# Patient Record
Sex: Female | Born: 1980 | Race: Black or African American | Hispanic: No | Marital: Married | Smoking: Former smoker
Health system: Southern US, Community
[De-identification: ages and names within clinical notes are randomized; demographics above are authoritative.]

## PROBLEM LIST (undated history)

## (undated) ENCOUNTER — Inpatient Hospital Stay (HOSPITAL_COMMUNITY): Payer: Self-pay

## (undated) DIAGNOSIS — I10 Essential (primary) hypertension: Secondary | ICD-10-CM

## (undated) DIAGNOSIS — I499 Cardiac arrhythmia, unspecified: Secondary | ICD-10-CM

## (undated) DIAGNOSIS — O24419 Gestational diabetes mellitus in pregnancy, unspecified control: Secondary | ICD-10-CM

## (undated) DIAGNOSIS — I4891 Unspecified atrial fibrillation: Secondary | ICD-10-CM

## (undated) DIAGNOSIS — K219 Gastro-esophageal reflux disease without esophagitis: Secondary | ICD-10-CM

## (undated) DIAGNOSIS — Z9889 Other specified postprocedural states: Secondary | ICD-10-CM

## (undated) DIAGNOSIS — J390 Retropharyngeal and parapharyngeal abscess: Secondary | ICD-10-CM

## (undated) DIAGNOSIS — T4145XA Adverse effect of unspecified anesthetic, initial encounter: Secondary | ICD-10-CM

## (undated) DIAGNOSIS — T8859XA Other complications of anesthesia, initial encounter: Secondary | ICD-10-CM

## (undated) DIAGNOSIS — R112 Nausea with vomiting, unspecified: Secondary | ICD-10-CM

---
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
## 2000-01-31 ENCOUNTER — Emergency Department (HOSPITAL_COMMUNITY): Admission: EM | Admit: 2000-01-31 | Discharge: 2000-01-31 | Payer: Self-pay | Admitting: Emergency Medicine

## 2001-06-17 ENCOUNTER — Emergency Department (HOSPITAL_COMMUNITY): Admission: EM | Admit: 2001-06-17 | Discharge: 2001-06-17 | Payer: Self-pay | Admitting: Emergency Medicine

## 2001-07-29 ENCOUNTER — Other Ambulatory Visit: Admission: RE | Admit: 2001-07-29 | Discharge: 2001-07-29 | Payer: Self-pay | Admitting: *Deleted

## 2001-07-29 ENCOUNTER — Encounter: Admission: RE | Admit: 2001-07-29 | Discharge: 2001-07-29 | Payer: Self-pay | Admitting: *Deleted

## 2001-08-19 ENCOUNTER — Encounter: Admission: RE | Admit: 2001-08-19 | Discharge: 2001-08-19 | Payer: Self-pay | Admitting: *Deleted

## 2001-09-11 ENCOUNTER — Encounter: Admission: RE | Admit: 2001-09-11 | Discharge: 2001-09-11 | Payer: Self-pay | Admitting: Obstetrics and Gynecology

## 2002-03-03 ENCOUNTER — Encounter: Admission: RE | Admit: 2002-03-03 | Discharge: 2002-03-03 | Payer: Self-pay | Admitting: Obstetrics and Gynecology

## 2002-03-03 ENCOUNTER — Other Ambulatory Visit: Admission: RE | Admit: 2002-03-03 | Discharge: 2002-03-03 | Payer: Self-pay | Admitting: *Deleted

## 2002-03-17 ENCOUNTER — Encounter: Admission: RE | Admit: 2002-03-17 | Discharge: 2002-03-17 | Payer: Self-pay | Admitting: Obstetrics and Gynecology

## 2002-04-21 ENCOUNTER — Encounter: Admission: RE | Admit: 2002-04-21 | Discharge: 2002-04-21 | Payer: Self-pay | Admitting: Obstetrics and Gynecology

## 2002-04-21 ENCOUNTER — Other Ambulatory Visit: Admission: RE | Admit: 2002-04-21 | Discharge: 2002-04-21 | Payer: Self-pay | Admitting: *Deleted

## 2002-05-05 ENCOUNTER — Encounter: Admission: RE | Admit: 2002-05-05 | Discharge: 2002-05-05 | Payer: Self-pay | Admitting: Obstetrics and Gynecology

## 2002-05-18 ENCOUNTER — Encounter (INDEPENDENT_AMBULATORY_CARE_PROVIDER_SITE_OTHER): Payer: Self-pay | Admitting: Specialist

## 2002-05-18 ENCOUNTER — Ambulatory Visit (HOSPITAL_COMMUNITY): Admission: RE | Admit: 2002-05-18 | Discharge: 2002-05-18 | Payer: Self-pay | Admitting: Obstetrics and Gynecology

## 2002-05-19 ENCOUNTER — Inpatient Hospital Stay (HOSPITAL_COMMUNITY): Admission: AD | Admit: 2002-05-19 | Discharge: 2002-05-19 | Payer: Self-pay | Admitting: *Deleted

## 2002-06-09 ENCOUNTER — Encounter: Admission: RE | Admit: 2002-06-09 | Discharge: 2002-06-09 | Payer: Self-pay | Admitting: Obstetrics and Gynecology

## 2002-08-04 ENCOUNTER — Encounter: Admission: RE | Admit: 2002-08-04 | Discharge: 2002-08-04 | Payer: Self-pay | Admitting: Obstetrics and Gynecology

## 2002-10-20 ENCOUNTER — Encounter: Admission: RE | Admit: 2002-10-20 | Discharge: 2002-10-20 | Payer: Self-pay | Admitting: Obstetrics and Gynecology

## 2002-10-20 ENCOUNTER — Encounter (INDEPENDENT_AMBULATORY_CARE_PROVIDER_SITE_OTHER): Payer: Self-pay | Admitting: *Deleted

## 2003-03-11 ENCOUNTER — Encounter: Admission: RE | Admit: 2003-03-11 | Discharge: 2003-03-11 | Payer: Self-pay | Admitting: Obstetrics and Gynecology

## 2003-06-28 ENCOUNTER — Encounter: Admission: RE | Admit: 2003-06-28 | Discharge: 2003-06-28 | Payer: Self-pay | Admitting: Obstetrics and Gynecology

## 2003-07-07 ENCOUNTER — Inpatient Hospital Stay (HOSPITAL_COMMUNITY): Admission: AD | Admit: 2003-07-07 | Discharge: 2003-07-07 | Payer: Self-pay | Admitting: Obstetrics and Gynecology

## 2003-07-20 ENCOUNTER — Encounter: Admission: RE | Admit: 2003-07-20 | Discharge: 2003-07-20 | Payer: Self-pay | Admitting: *Deleted

## 2003-07-30 ENCOUNTER — Inpatient Hospital Stay (HOSPITAL_COMMUNITY): Admission: AD | Admit: 2003-07-30 | Discharge: 2003-07-30 | Payer: Self-pay | Admitting: Obstetrics and Gynecology

## 2003-08-26 ENCOUNTER — Encounter: Admission: RE | Admit: 2003-08-26 | Discharge: 2003-08-26 | Payer: Self-pay | Admitting: Family Medicine

## 2003-09-08 ENCOUNTER — Encounter: Admission: RE | Admit: 2003-09-08 | Discharge: 2003-09-08 | Payer: Self-pay | Admitting: *Deleted

## 2003-09-09 ENCOUNTER — Ambulatory Visit (HOSPITAL_COMMUNITY): Admission: RE | Admit: 2003-09-09 | Discharge: 2003-09-09 | Payer: Self-pay | Admitting: Family Medicine

## 2003-09-15 ENCOUNTER — Encounter: Admission: RE | Admit: 2003-09-15 | Discharge: 2003-09-15 | Payer: Self-pay | Admitting: *Deleted

## 2003-09-19 ENCOUNTER — Inpatient Hospital Stay (HOSPITAL_COMMUNITY): Admission: AD | Admit: 2003-09-19 | Discharge: 2003-09-19 | Payer: Self-pay | Admitting: Family Medicine

## 2003-09-23 ENCOUNTER — Ambulatory Visit: Payer: Self-pay | Admitting: Family Medicine

## 2003-09-23 ENCOUNTER — Ambulatory Visit (HOSPITAL_COMMUNITY): Admission: RE | Admit: 2003-09-23 | Discharge: 2003-09-23 | Payer: Self-pay | Admitting: Obstetrics and Gynecology

## 2003-10-07 ENCOUNTER — Ambulatory Visit: Payer: Self-pay | Admitting: Family Medicine

## 2003-10-07 ENCOUNTER — Ambulatory Visit (HOSPITAL_COMMUNITY): Admission: RE | Admit: 2003-10-07 | Discharge: 2003-10-07 | Payer: Self-pay | Admitting: *Deleted

## 2003-10-21 ENCOUNTER — Ambulatory Visit: Payer: Self-pay | Admitting: Family Medicine

## 2003-10-21 ENCOUNTER — Ambulatory Visit (HOSPITAL_COMMUNITY): Admission: RE | Admit: 2003-10-21 | Discharge: 2003-10-21 | Payer: Self-pay | Admitting: *Deleted

## 2003-11-04 ENCOUNTER — Ambulatory Visit: Payer: Self-pay | Admitting: Family Medicine

## 2003-11-11 ENCOUNTER — Inpatient Hospital Stay (HOSPITAL_COMMUNITY): Admission: AD | Admit: 2003-11-11 | Discharge: 2003-11-11 | Payer: Self-pay | Admitting: Obstetrics & Gynecology

## 2003-11-18 ENCOUNTER — Ambulatory Visit: Payer: Self-pay | Admitting: *Deleted

## 2003-11-24 ENCOUNTER — Ambulatory Visit (HOSPITAL_COMMUNITY): Admission: RE | Admit: 2003-11-24 | Discharge: 2003-11-24 | Payer: Self-pay | Admitting: Family Medicine

## 2003-11-25 ENCOUNTER — Ambulatory Visit: Payer: Self-pay | Admitting: Family Medicine

## 2003-12-03 ENCOUNTER — Ambulatory Visit: Payer: Self-pay | Admitting: Family Medicine

## 2003-12-09 ENCOUNTER — Ambulatory Visit: Payer: Self-pay | Admitting: Family Medicine

## 2003-12-23 ENCOUNTER — Ambulatory Visit: Payer: Self-pay | Admitting: Family Medicine

## 2004-01-13 ENCOUNTER — Ambulatory Visit: Payer: Self-pay | Admitting: Family Medicine

## 2004-01-27 ENCOUNTER — Ambulatory Visit: Payer: Self-pay | Admitting: Family Medicine

## 2004-02-02 ENCOUNTER — Ambulatory Visit: Payer: Self-pay | Admitting: *Deleted

## 2004-02-10 ENCOUNTER — Ambulatory Visit: Payer: Self-pay | Admitting: Family Medicine

## 2004-02-17 ENCOUNTER — Ambulatory Visit: Payer: Self-pay | Admitting: Family Medicine

## 2004-02-19 ENCOUNTER — Ambulatory Visit: Payer: Self-pay | Admitting: Family Medicine

## 2004-02-19 ENCOUNTER — Inpatient Hospital Stay (HOSPITAL_COMMUNITY): Admission: AD | Admit: 2004-02-19 | Discharge: 2004-02-19 | Payer: Self-pay | Admitting: Family Medicine

## 2004-02-24 ENCOUNTER — Ambulatory Visit: Payer: Self-pay | Admitting: Family Medicine

## 2004-02-24 ENCOUNTER — Ambulatory Visit: Payer: Self-pay | Admitting: Obstetrics and Gynecology

## 2004-02-24 ENCOUNTER — Inpatient Hospital Stay (HOSPITAL_COMMUNITY): Admission: AD | Admit: 2004-02-24 | Discharge: 2004-02-29 | Payer: Self-pay | Admitting: *Deleted

## 2004-02-26 ENCOUNTER — Encounter (INDEPENDENT_AMBULATORY_CARE_PROVIDER_SITE_OTHER): Payer: Self-pay | Admitting: *Deleted

## 2004-03-10 ENCOUNTER — Ambulatory Visit: Payer: Self-pay | Admitting: Family Medicine

## 2004-08-24 ENCOUNTER — Ambulatory Visit: Payer: Self-pay | Admitting: Obstetrics and Gynecology

## 2004-10-12 ENCOUNTER — Emergency Department (HOSPITAL_COMMUNITY): Admission: EM | Admit: 2004-10-12 | Discharge: 2004-10-12 | Payer: Self-pay | Admitting: Family Medicine

## 2005-04-24 ENCOUNTER — Emergency Department (HOSPITAL_COMMUNITY): Admission: EM | Admit: 2005-04-24 | Discharge: 2005-04-24 | Payer: Self-pay | Admitting: Family Medicine

## 2005-05-23 ENCOUNTER — Encounter: Payer: Self-pay | Admitting: Emergency Medicine

## 2006-11-30 ENCOUNTER — Emergency Department (HOSPITAL_COMMUNITY): Admission: EM | Admit: 2006-11-30 | Discharge: 2006-11-30 | Payer: Self-pay | Admitting: Family Medicine

## 2007-01-23 DIAGNOSIS — O24419 Gestational diabetes mellitus in pregnancy, unspecified control: Secondary | ICD-10-CM

## 2007-01-23 HISTORY — DX: Gestational diabetes mellitus in pregnancy, unspecified control: O24.419

## 2007-07-06 ENCOUNTER — Inpatient Hospital Stay (HOSPITAL_COMMUNITY): Admission: AD | Admit: 2007-07-06 | Discharge: 2007-07-06 | Payer: Self-pay | Admitting: Obstetrics & Gynecology

## 2007-10-11 IMAGING — CR DG KNEE COMPLETE 4+V*L*
4 series · 4 of 4 positions shown · non-contrast
Comparison: none

CLINICAL DATA: Patient had a fall 2 months ago, continues to have pain and swelling of the entire knee.
 LEFT KNEE ? 4 VIEW:

[view not recorded (1 of 4)]
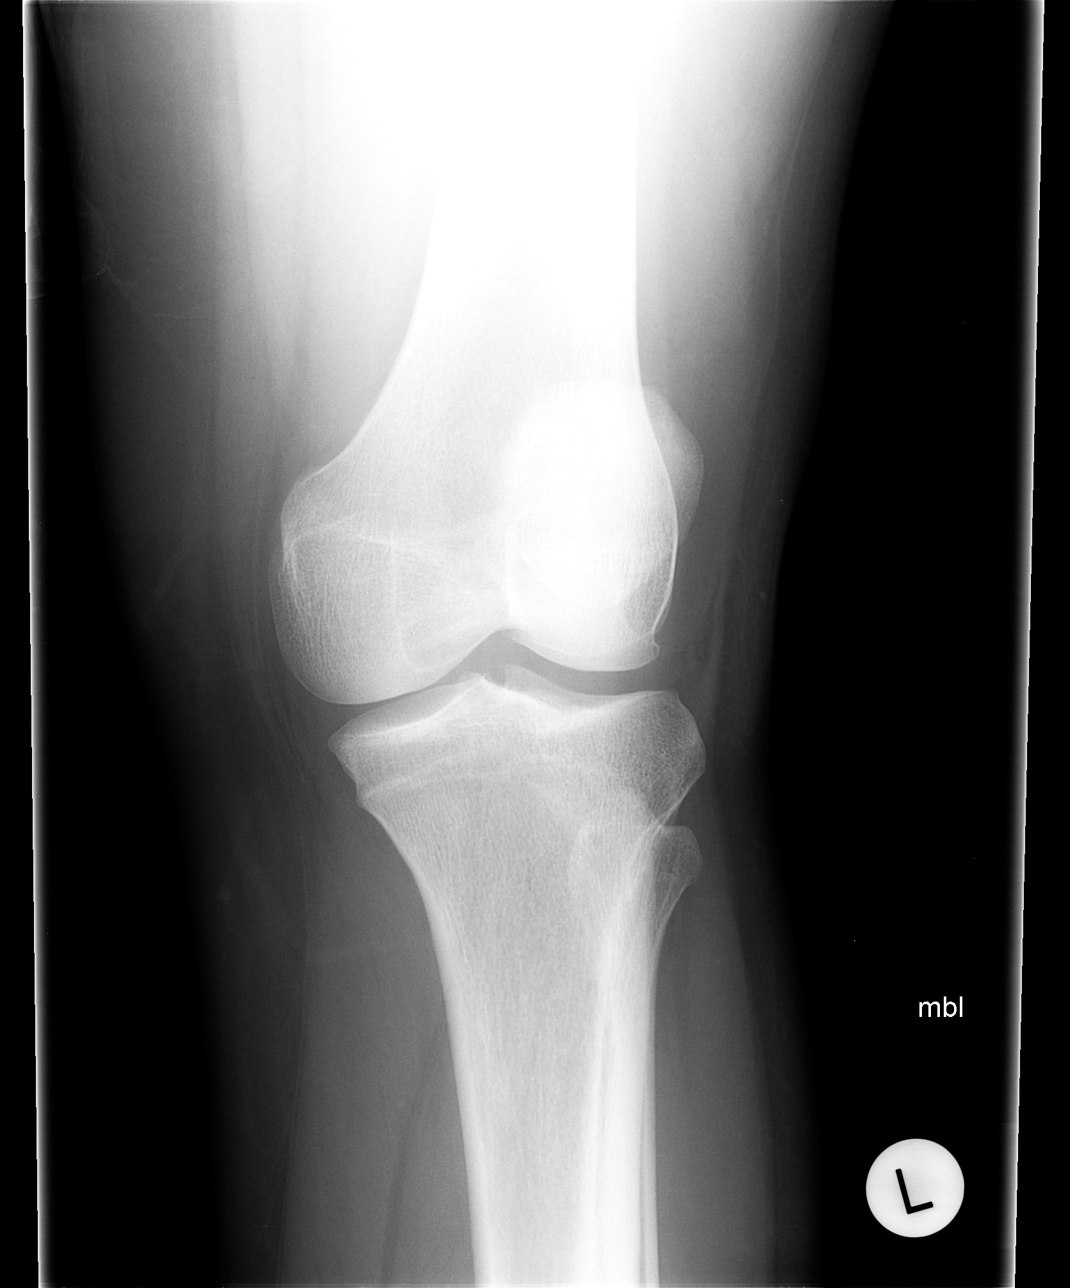

[view not recorded (2 of 4)]
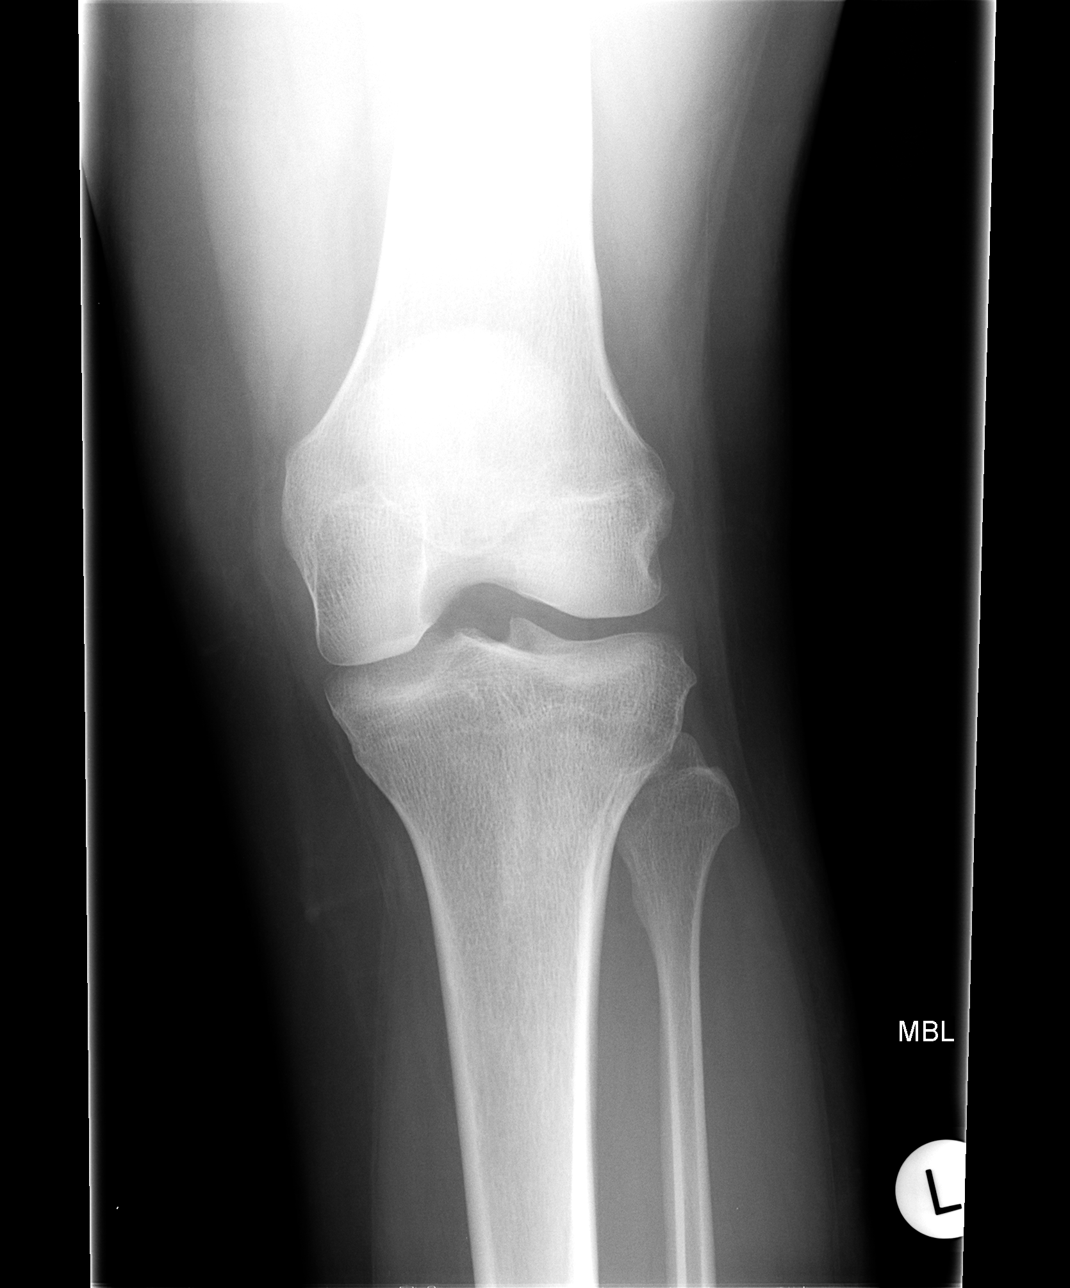

[view not recorded (3 of 4)]
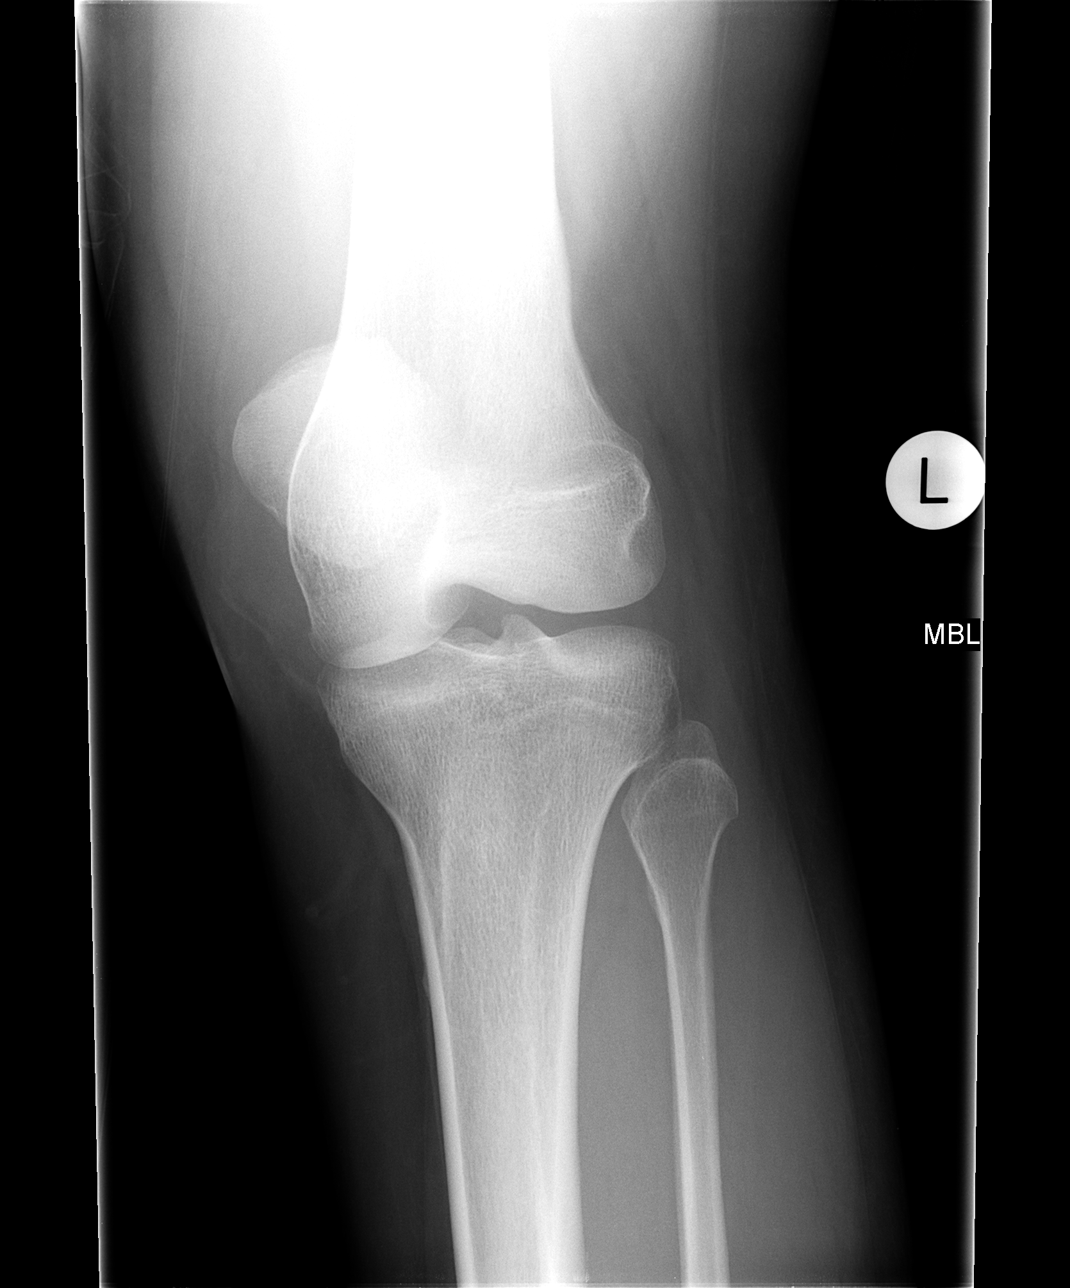

[view not recorded (4 of 4)]
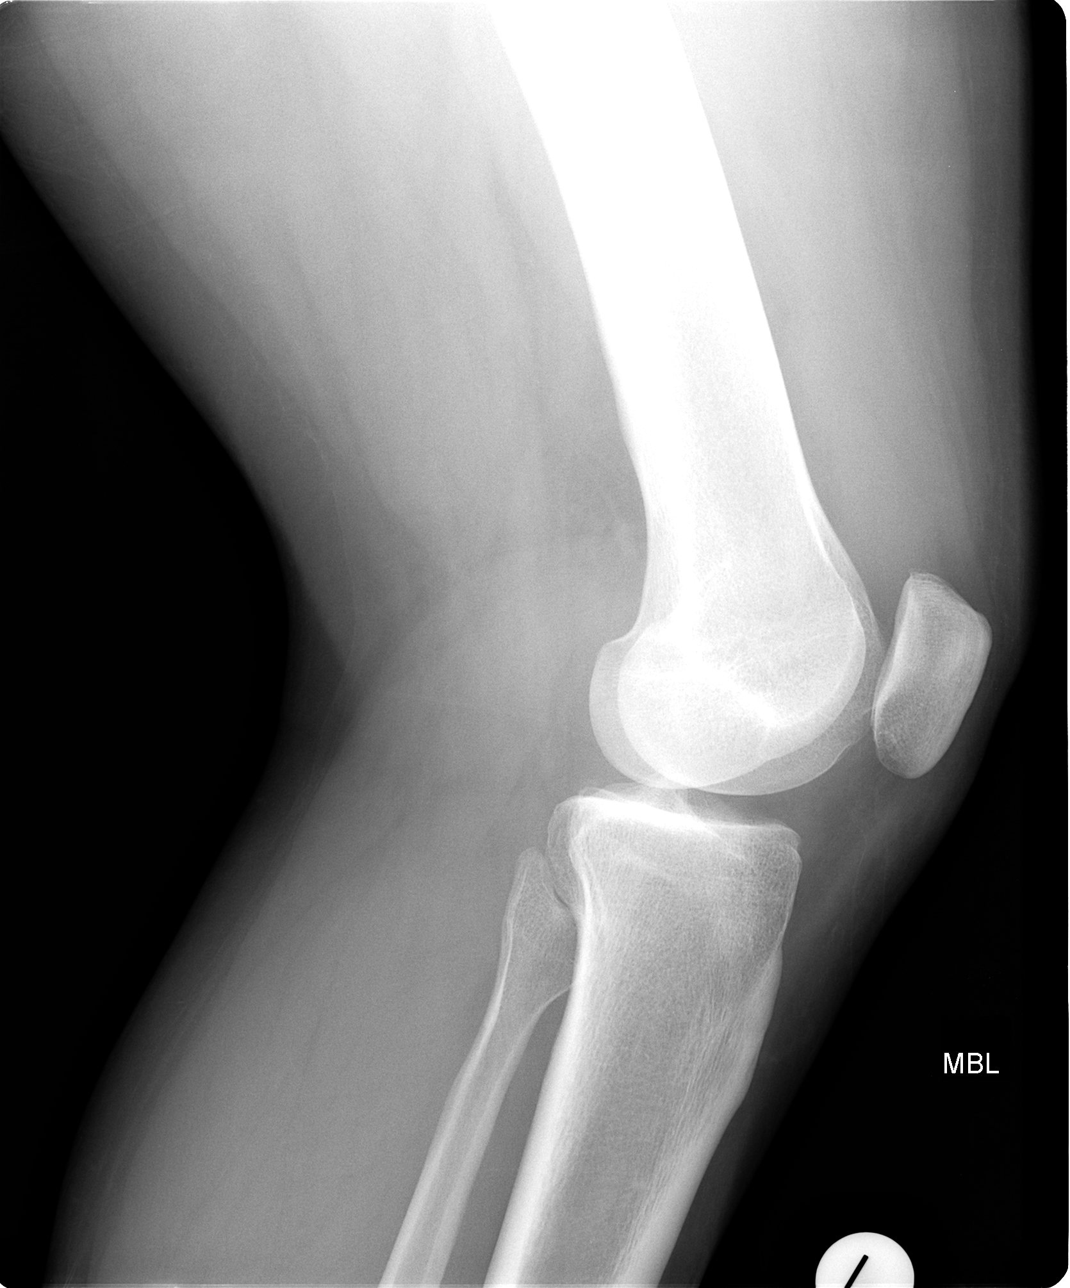

[4 of 4 positions shown; findings below may reference images not displayed]

FINDINGS: AP, both oblique and lateral views of the left knee are made without previous films for comparison and show no definite fracture, dislocation or foreign body.  There may be a small joint effusion.
IMPRESSION: No definite fracture, dislocation, or foreign body.  Possible small joint effusion.

## 2007-12-10 ENCOUNTER — Encounter: Admission: RE | Admit: 2007-12-10 | Discharge: 2007-12-10 | Payer: Self-pay | Admitting: Obstetrics and Gynecology

## 2008-01-13 ENCOUNTER — Encounter (INDEPENDENT_AMBULATORY_CARE_PROVIDER_SITE_OTHER): Payer: Self-pay | Admitting: Obstetrics and Gynecology

## 2008-01-13 ENCOUNTER — Inpatient Hospital Stay (HOSPITAL_COMMUNITY): Admission: RE | Admit: 2008-01-13 | Discharge: 2008-01-15 | Payer: Self-pay | Admitting: Obstetrics and Gynecology

## 2008-01-22 ENCOUNTER — Inpatient Hospital Stay (HOSPITAL_COMMUNITY): Admission: AD | Admit: 2008-01-22 | Discharge: 2008-01-22 | Payer: Self-pay | Admitting: Obstetrics and Gynecology

## 2008-01-24 ENCOUNTER — Inpatient Hospital Stay (HOSPITAL_COMMUNITY): Admission: AD | Admit: 2008-01-24 | Discharge: 2008-01-24 | Payer: Self-pay | Admitting: Obstetrics and Gynecology

## 2009-12-22 IMAGING — US US OB COMP LESS 14 WK
1 series · 14 of 15 positions shown · non-contrast
Comparison: 

CLINICAL DATA: 11 weeks pregnant.  Headache.  Nausea vomiting and
cramping.

OB ULTRASOUND < 14 WEEKS, TRANSABDOMINAL
TECHNIQUE: Multiplanar gray scale ultrasound of the uterus and
adnexa was performed from a transabdominal approach.

[Series 1: us ob comp less 14 wks · 14 of 15 slices shown]
[im 1/15]
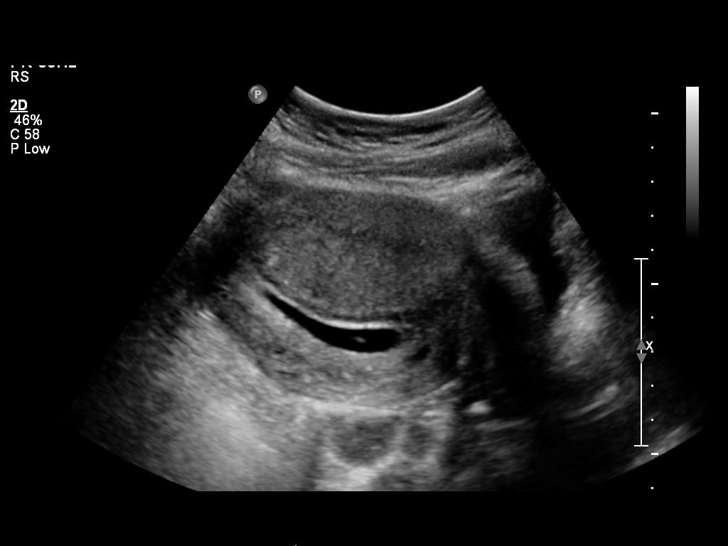
[im 2/15]
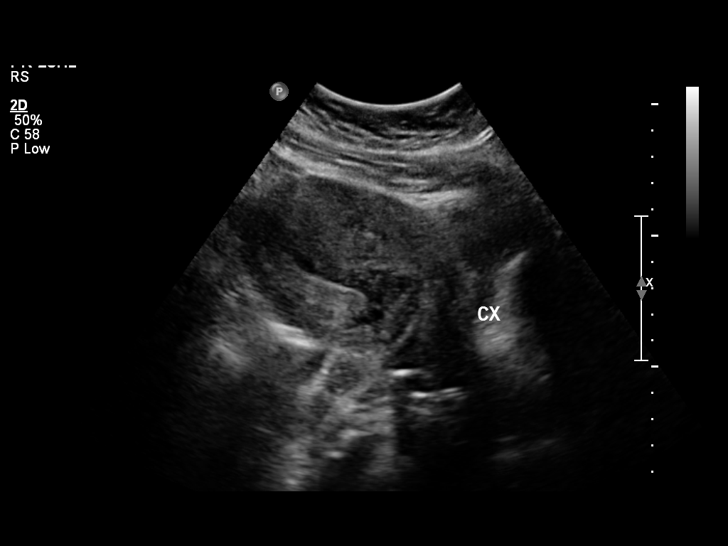
[im 3/15]
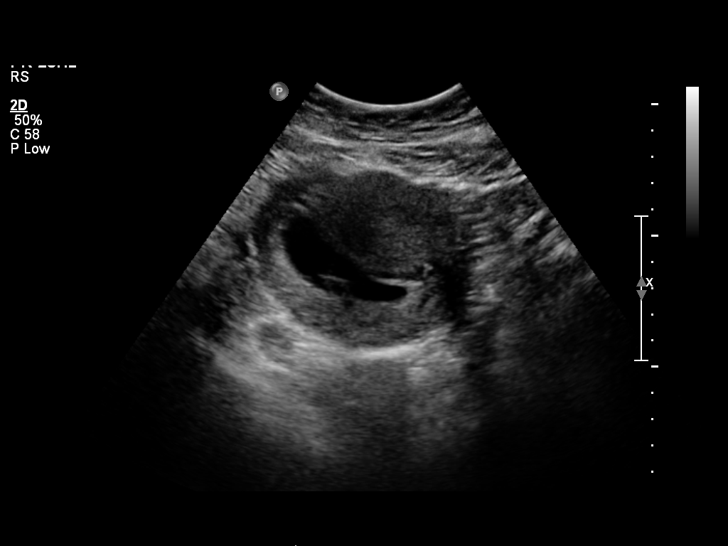
[im 4/15]
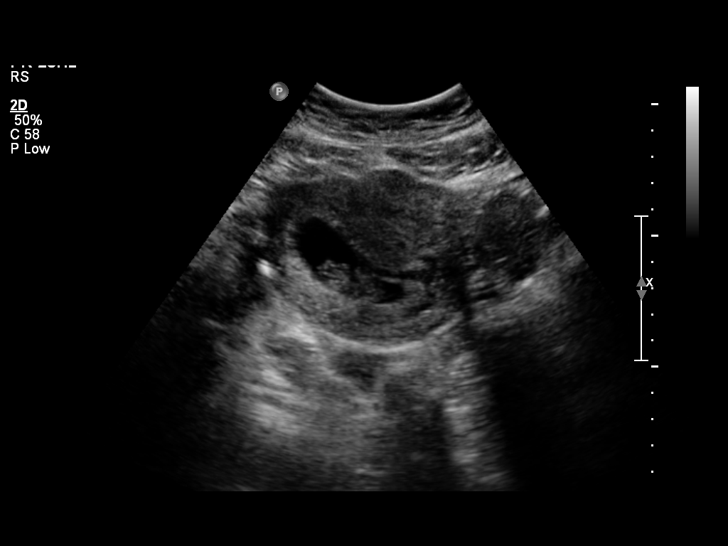
[im 5/15]
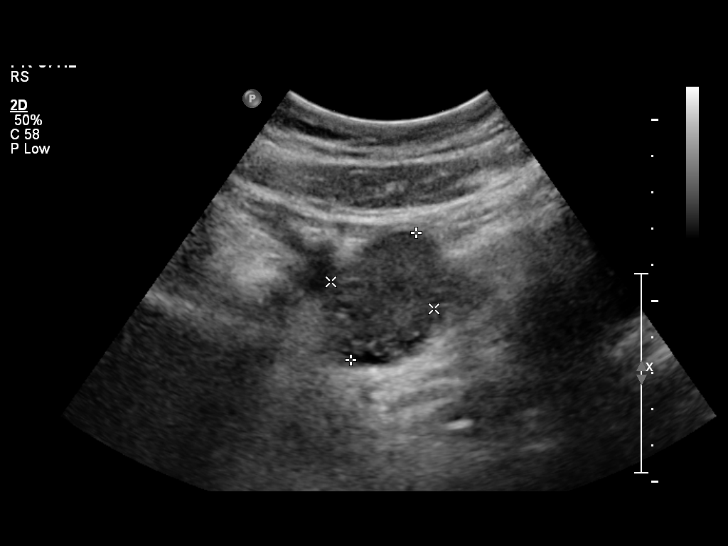
[im 6/15]
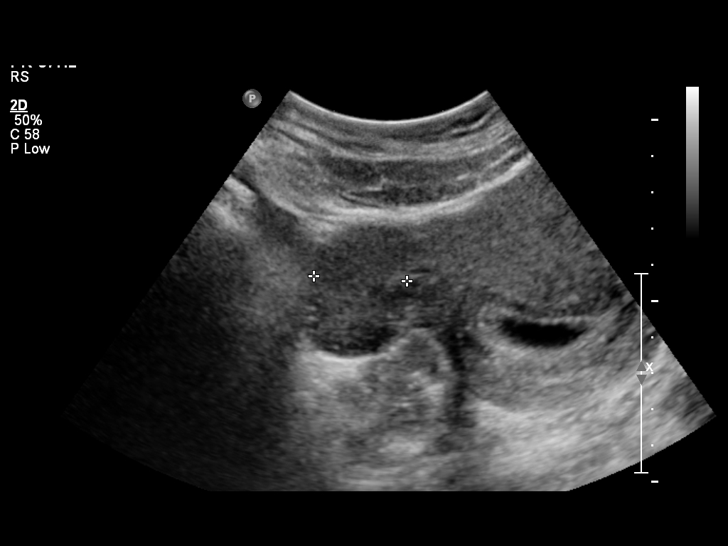
[im 7/15]
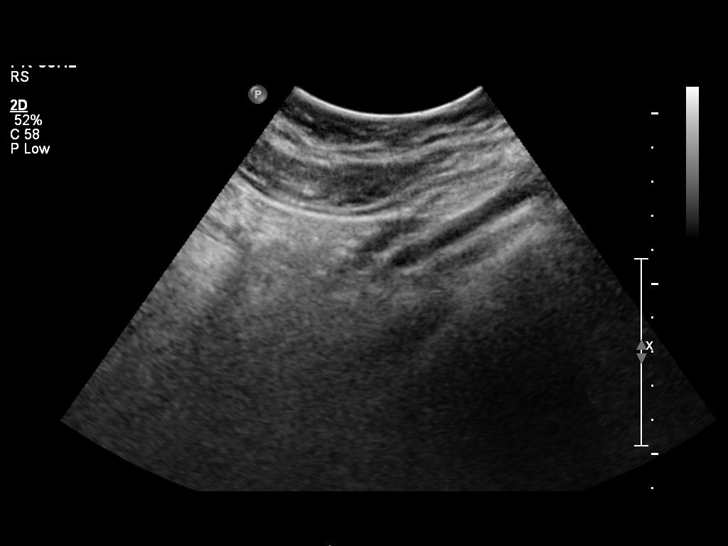
[im 9/15]
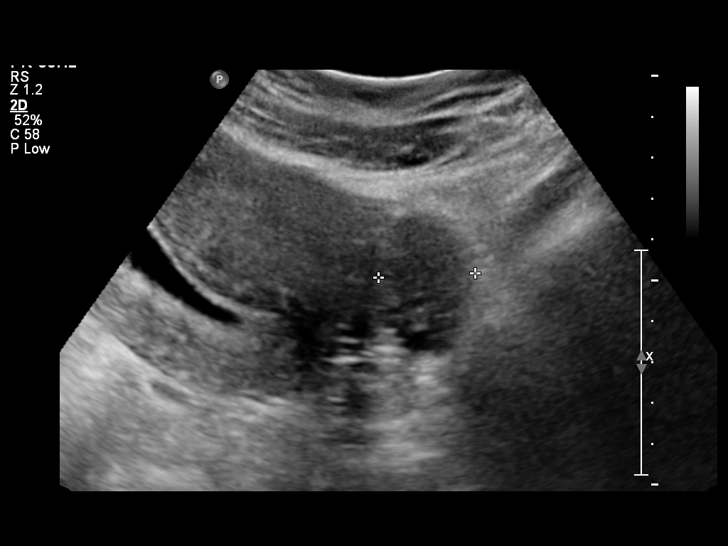
[im 10/15]
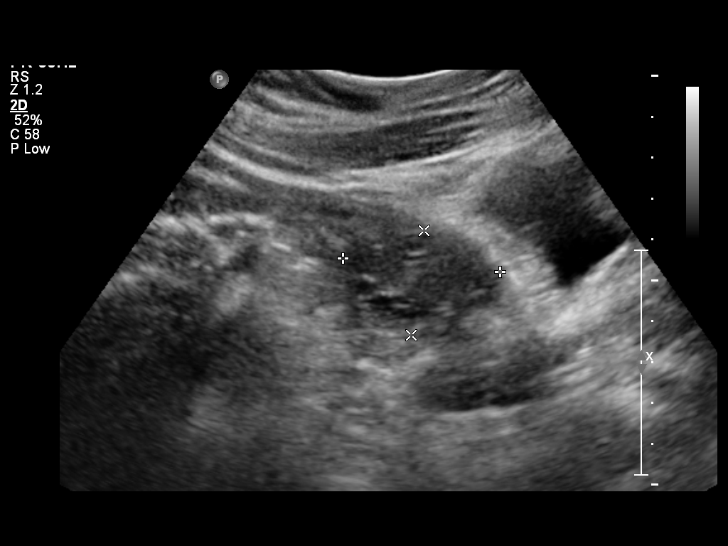
[im 11/15]
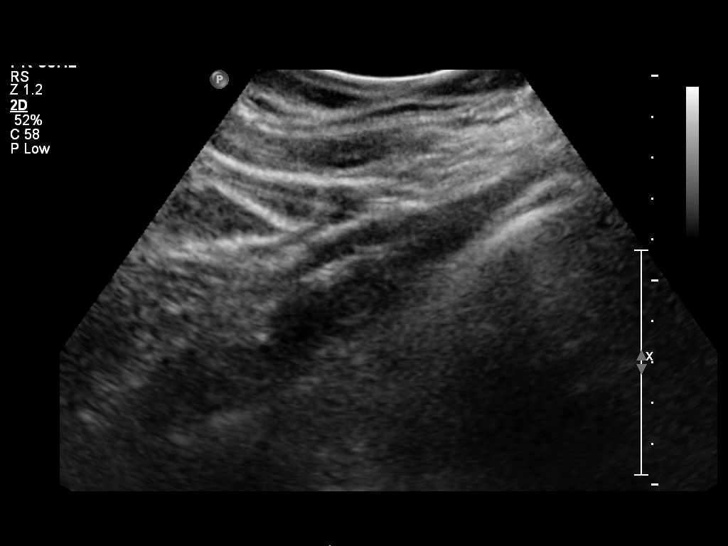
[im 12/15]
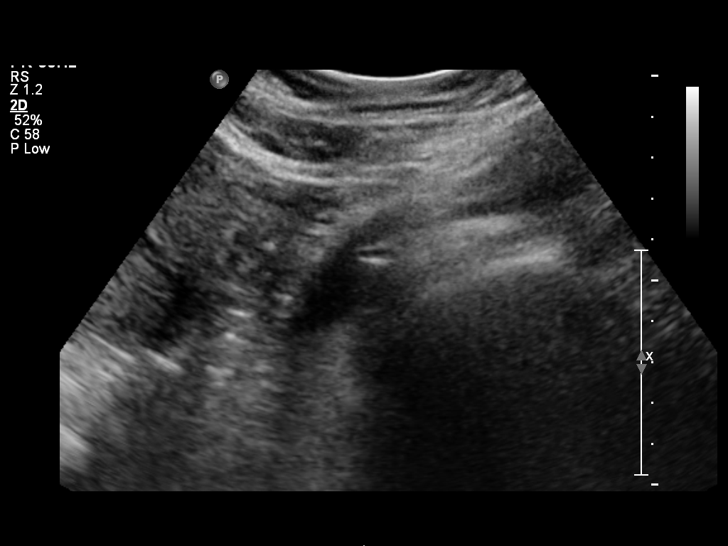
[im 13/15]
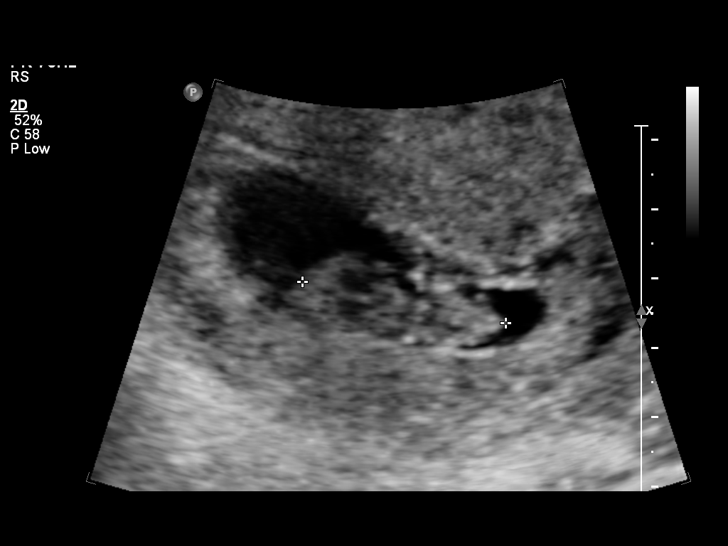
[im 14/15]
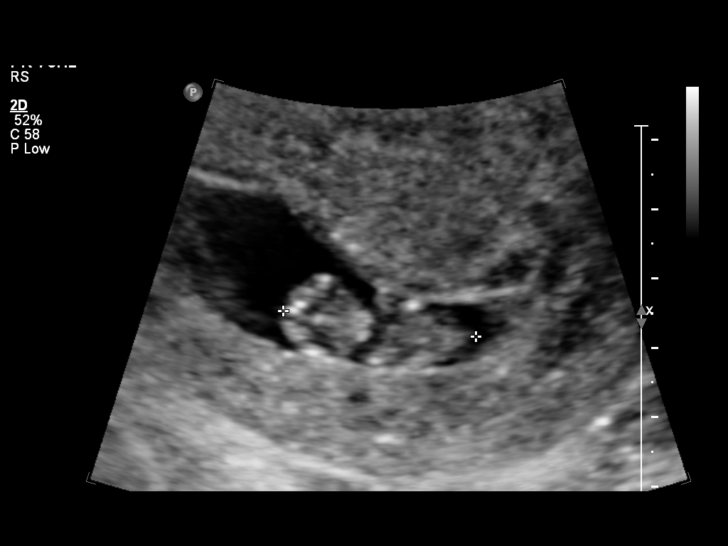
[im 15/15]
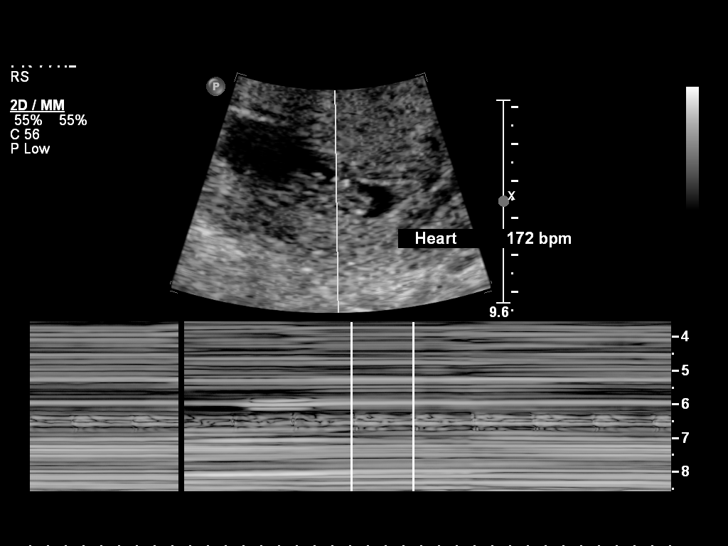

[14 of 15 positions shown; findings below may reference images not displayed]

FINDINGS: Intrauterine gestational sac with a intrauterine
gestation.  Crown-rump length of 3 mm corresponds to 9 weeks 6
days.

Cardiac activity with fetal heart rate of 172 beats per minute.

No evidence of subchorionic hemorrhage.  Both ovaries are normal in
size and morphology.  No significant free fluid.
IMPRESSION: 1.  Intrauterine pregnancy corresponding to approximately 9-week 6
days with fetal heart rate of 172 beats per minute.
FINDINGS: 
IMPRESSION:

## 2010-05-27 ENCOUNTER — Inpatient Hospital Stay (INDEPENDENT_AMBULATORY_CARE_PROVIDER_SITE_OTHER)
Admission: RE | Admit: 2010-05-27 | Discharge: 2010-05-27 | Disposition: A | Discharge status: Home / Self Care | Payer: Self-pay | Source: Ambulatory Visit | Attending: Family Medicine | Admitting: Family Medicine

## 2010-05-27 DIAGNOSIS — K089 Disorder of teeth and supporting structures, unspecified: Secondary | ICD-10-CM

## 2010-06-06 NOTE — Op Note (Signed)
[name], [name] NO.:  [id number]   MEDICAL RECORD NO.:  [id number]          PATIENT TYPE:  INP   LOCATION:  9142                          [facility]   PHYSICIAN:  Crist Fat. Rivard, M.D. DATE OF BIRTH:  06/27/1980   DATE OF PROCEDURE:  01/13/2008  DATE OF DISCHARGE:                               OPERATIVE REPORT   PREOPERATIVE DIAGNOSES:  1. Intrauterine pregnancy at 37 weeks and 3 days with previous      cesarean section.  2. Gestational diabetes, diet controlled.  3. Oligohydramnios.   POSTOPERATIVE DIAGNOSES:  1. Intrauterine pregnancy at 37 weeks and 3 days with previous      cesarean section.  2. Gestational diabetes, diet controlled.  3. Oligohydramnios.   ANESTHESIA:  Spinal, Dorinda Hill T. Pamalee Leyden, MD, then general, Octaviano Glow.  Pamalee Leyden, MD   PROCEDURE:  Repeat low transverse cesarean section and lysis of  adhesions.   SURGEON:  Crist Fat. Rivard, MD   ASSISTANT:  Alonna Minium, certified nurse midwife.   ESTIMATED BLOOD LOSS:  600 mL.   PROCEDURE:  After being informed of the planned procedure with possible  complications including bleeding, infection, injury to other organs as  well as neonatal complications for a 26-week-old baby requiring some  transitional support.  Informed consent was obtained.  The patient was  taken to cesarean suite, given spinal anesthesia without complication.  She was placed in the dorsal decubitus position, pelvis tilted to the  left, prepped and draped in a sterile fashion, and a Foley catheter is  inserted in her bladder.  After assessing adequate level of anesthesia,  the previous incision was infiltrated with 20 mL of Marcaine 0.25 and we  performed a Pfannenstiel incision which was brought down sharply to the  fascia.  The fascia was incised in a low-transverse fashion.  Linea alba  was dissected and peritoneum was entered in the midline fashion.  At  this time, we noted that the uterus was extremely  tilted to the left,  but nevertheless we were able to place our [facility].  This gave  [place] access to the lower uterine segment and the visceral peritoneum was  entered in a low-transverse fashion allowing [place] to safely retract  bladder by developing a bladder flap.  The myometrium was then entered  in a low-transverse fashion first with knife then extended bluntly.  Amniotic fluid was clear.  We attempted to deliver the baby with just  fundal pressure which was impossible and so a vacuum was inserted,  placed on the top of the head and the baby was delivered.  Mouth and  nose were suctioned.  Cord was clamped with 2 Kelly clamps and sectioned  and baby was given to the pediatrician present in the room.  The  placenta was delivered manually.  Uterine revision was negative.  We  closed the myometrium in 2 layers, first with a running lock suture of 0  Vicryl then with a Lembert suture of 0 Vicryl imbricating the first one.  Hemostasis on the myometrium was adequate and at that moment that we can  explore furthermore white the uterus was tilted to the left and we did  find a thick adhesion between the abdominal wall and the left cornua.  Retractors were removed and we were able to proceed with lysis of  adhesion at that level using cauterization.  Two more adhesions with  peritoneum were removed the same way.  Hemostasis was adequate.  Both  paracolic gutters were cleaned.  Both tubes and ovaries were assessed  and normal.  The pelvis was profusely irrigated with warm saline and  hemostasis was completed with cautery on peritoneal edges.  All  retractors were then removed and under fascia hemostasis was completed  with cautery.  A sheet of intercede was deposited on that cornual site  of lysis of adhesions and the fascia was closed with 2 running sutures  of 1 Vicryl meeting midline.  The wound was irrigated with warm saline.  This previous skin was closed with subcuticular suture of 3-0  Monocryl  and Steri-Strips.   Instrument and sponge count was complete x2.  Estimated blood loss was  600 mL in the procedure was very well tolerated by the patient who was  taken to recovery room in a well and stable condition.   SPECIMEN:  Placenta is sent to cord blood donation then to pathology  with a diagnosis of oligohydramnios.   A little girl, unnamed at this time, was born at 11:52 a.m. received an  Apgar of 9 at 1 minute and 9 at 5 minutes, weighted 6 pounds 4 ounces  and was taken to full-term nursery.      Crist Fat Rivard, M.D.  Electronically Signed     SAR/MEDQ  D:  01/13/2008  T:  01/13/2008  Job:  [id number]

## 2010-06-06 NOTE — Discharge Summary (Signed)
[name], ADES NO.:  [id number]   MEDICAL RECORD NO.:  [id number]          PATIENT TYPE:  INP   LOCATION:  9142                          [facility]   PHYSICIAN:  Osborn Coho, M.D.   DATE OF BIRTH:  Sep 14, 1980   DATE OF ADMISSION:  01/13/2008  DATE OF DISCHARGE:  01/15/2008                               DISCHARGE SUMMARY   ADMITTING DIAGNOSES:  1. Intrauterine pregnancy at 37 and 3/7 weeks.  2. Previous cesarean section.  3. Gestational diabetes.  4. Oligohydramnios.   DISCHARGE DIAGNOSES:  1. Intrauterine pregnancy at 37 and 3/7 weeks.  2. Previous cesarean section.  3. Gestational diabetes.  4. Oligohydramnios.   PROCEDURES:  1. Repeat low transverse cesarean section.  2. Spinal anesthesia.  3. General anesthesia and spinal supplement.   HOSPITAL COURSE:  [name] is a 30 year old gravida 2, para 1-0-0-1,  who was admitted on January 13, 2008, for elective scheduled repeat  cesarean section at 37 and 3/7 weeks secondary to oligohydramnios.  The  patient at that time was given the option of modified bedrest for 48  hours, increasing fluid intake, repeating ultrasound in 48 hours to see  improvement or given a second option to proceed with delivery.  After  all the risk and benefits of each were reviewed with the patient, the  patient elected to proceed with repeat cesarean section.  Her pregnancy  was remarkable for:  1. Previous cesarean section with desire for repeat.  2. 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  3. History of cervical conization.  4. Obesity.  A positive PPD.  Skin test was done in 2006 and 2007, but      no chest x-ray was done at that time.  5. Gestational diabetes, diet controlled.   The patient was taken to the operating room, where a repeat low  transverse cesarean section was performed under spinal anesthesia.  However, the patient did not have adequate pain management and  subsequently had to have  general to complete the surgery.  Findings were  a viable female by the name Thompson Grayer, born at 11:52 a.m. on January 13, 2008, Apgars 9 and 9, weight 6 pounds 4 ounces.  Infant was taken to  the full-term nursery.  Mother was taken to recovery in good condition.  By postop day #1, the patient was doing well.  She was up ad lib.  She  had a hemoglobin of 10.4 down from 10.8, white blood cell count of 13.4,  and platelet count of 162.  Her physical exam was within normal limits.  Her post delivery glucose had been 89.  Her incision was clean, dry, and  intact with dressing in place.  She did have some hypoactive bowel  sounds at that time.  She was breast and bottle feeding.  Postop day #2,  the patient was continuing to do well.  She was pumping and trying to  feed her breast, but did not yet had a bowel movement, but there was  hope that the baby will be able to go home today and since mom  did  request discharge today.  The patient was up ad lib.  Her vital signs  were stable.  She was afebrile.  Her fasting blood sugar was 93.  Abdomen was soft and nontender.  She did have bowel sounds noted.  Incision was clean, dry, and intact.  She had been up to the shower  without difficulty.  She was deemed to receive full benefit of hospital  stay and was discharged home.   DISCHARGE INSTRUCTIONS:  Per [facility] handout.   DISCHARGE MEDICATIONS:  1. Motrin 600 mg p.o. q.6 h. p.r.n. pain.  2. Percocet 5/325 one to two p.o. q.3-4 h. p.r.n. pain.  3. The patient was currently undecided about birth control.   DISCHARGE FOLLOWUP:  Occur in 6 weeks, [facility] or p.r.n.      [name], C.N.M.      Osborn Coho, M.D.  Electronically Signed    VLL/MEDQ  D:  01/15/2008  T:  01/15/2008  Job:  [id number]

## 2010-06-06 NOTE — H&P (Signed)
NAMEMAVERY, MILLING NO.:  [id number]   MEDICAL RECORD NO.:  [id number]          PATIENT TYPE:  INP   LOCATION:  9142                          [facility]   PHYSICIAN:  Crist Fat. Rivard, M.D. DATE OF BIRTH:  Jan 27, 1980   DATE OF ADMISSION:  01/13/2008  DATE OF DISCHARGE:                              HISTORY & PHYSICAL   [name] is a 30 year old married black female, gravida 2, para 1-0-0-  1 who presents on the day of admission at 49 and 3/7 weeks per an [facility] of  February 02, 2008 for an elective scheduled repeat Cesarean section.  Patient was seen in the office yesterday and had an ultrasound showing  borderline oligohydramnios.  Patient was given the options of modified  bedrest for 48 hours, increasing fluid intake and repeating the  ultrasound after 48 hours to see if improvement or second option to  proceed with delivery and patient, after risks and benefits and  alternatives were reviewed for both of those options, did elect to  proceed today with a repeat Cesarean section.  Patient is without  complaints this morning, she reports good fetal movement.  She has been  followed by MD service at [facility].   HOSPITAL COURSE:  1. Previous C-scetion with desire for repeat.  2. 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  3. History of cervical conization.  4. Obesity.   PRENATAL LABS:  Blood type is O+, Rh antibody screen negative, sickle  cell screen negative, RPR nonreactive, rubella titer immune, hepatitis  surface antigen negative, HIV nonreactive.  Hemoglobin at her new OB  visit which was July 6 was 11.6 and her hematocrit was 34.3, her  platelets were 235.   PAST MEDICAL HISTORY:   ALLERGIES:  SHE DENIES MEDICATION OR LATEX ALLERGY OR OTHER  SENSITIVITIES.   MENSTRUAL HISTORY:  Reports menarche at age 26, monthly cycles with no  abnormality.  She had a certain LMP of March 35, 2009 giving her an  original [employer] by her LMP of January 21, 2008.   However, her EDC was  changed to February 02, 2008 after a 9 and 6/7 week ultrasound variation.   CONTRACEPTION IN THE PAST:  She has used Depo, Ortho Tri-Cyclen.   She has had a history of abnormal Pap smears, normal Paps since the  conization in 2001.  Treated for trich in 2001, chickenpox age 89.  She  had a positive PPD skin test 2006 - 2007, no chest x-ray was done.  Patient's brother was treated for TB.  Husband is a smoker.  She has had  a C-section 2006.  Conization of cervix 2001.  Laser ear surgery 1997.  Anesthesia does sometimes cause an increase in vomiting.  GERD before  her first pregnancy.   FAMILY HISTORY:  Maternal grandfather, paternal grandmother chronic  hypertension.  Brother treated for tuberculosis.  Maternal grandfather  and paternal grandmother diabetic.  Maternal grandfather is on insulin,  paternal grandmother unsure if on medications.  Paternal grandmother  breast cancer.   GENETIC HISTORY:  Father of the baby's maternal aunt congenital heart  disease.  SOCIAL HISTORY:  She is a married black female.  Father of baby and  husband's name in [place], he is involved and supportive.  Patient part-time employment as an Government social research officer.  She has had some  college education.  Father of the baby part-time student as well as part-  time employment.  Patient denied alcohol, tobacco or illicit drug use.   MEDICATIONS:  She is on a prenatal vitamin 1 tab p.o. daily.   HISTORY OF PRESENT PREGNANCY:  She entered care July 2 for new OB  interview.  July 6 she had her new OB workup.  She was unsure of  pregravid weight.  She is 5 foot 10 inches tall, BMI is 31.6.  She was  evaluated early on in the pregnancy at [facility] for some cramping, had some  pregnancy nausea, vomiting early on and prescribed Phenergan.  At new OB  her weight was 222.  Wet prep positive for BV at that time, was treated  with metronidazole for 7 seven days.  She had a normal first trimester   screen done July 16, she also had a normal AFP following that.  Anatomy  ultrasound at approximately 20 weeks was SIUP, size was equal to  previous dating, cervical length 3.6 cm, posterior placenta shows an  anterior fibroid 4.6 cm at that time, normal anatomy, face was poorly  seen and the sono was planned to be repeated in 3 weeks, suggested a  female, they are unsure on name at present.  The followup on anatomy not  previously seen at 23 weeks was all seen at her next ultrasound and was  within normal limits.  She did have a single closure with her previous C-  section.  She had her 1-hour Glucola at 27 and 3/7 weeks, weight at the  time was 235.  The 1-hour GGT was elevated at 135 and was scheduled for  a 3-hour.  Her 3-hour was within normal limits, however the fasting was  105.  She received her H1N1 at 29 and 4/7 weeks, the vaccine.  During  the pregnancy, the patient was undecided until third trimester on VBAC  or C-section and then elected to proceed with repeat C-section.  Was  sent to diabetic management for diet review and did do CBGs x2 weeks for  glycosuria.  Patient's pregnancy continued to progress without any other  complications of significance until her ultrasound yesterday showing the  borderline oligohydramnios and thus given the options and subsequent  presentation today for delivery.   OBJECTIVE:  She is afebrile, her vital signs are stable.   PHYSICAL EXAMINATION:  GENERAL:  No acute distress, alert and oriented  x3, she is pleasant.  HEENT:  Grossly intact and within normal limits.  She does wear glasses.  CARDIOVASCULAR:  Regular rate and rhythm without murmur.  LUNGS:  Clear, on her anterior lobes she does have some expiratory  wheezes that are very mild.  She does not have a history of asthma.  ABDOMEN:  Soft, nontender and gravid.  PELVIC:  Exam was deferred.  EXTREMITIES:  Without edema, no clonus and DTRs are 2+.   IMPRESSION:  1. Intrauterine  pregnancy at 37 and 3/7 weeks.  2. Previous C-section with desire for repeat.  3. History of a positive purified protein derivative with no      treatment.  4. History of cervical conization.  5. Obesity.   PLAN:  Today after admission to short stay, Dr. Estanislado Pandy at bedside again  reviewed options with patient and patient's significant other regarding  patient's status and fetal status and given the options again of number  one, continue of modified bedrest with continued increase in p.o. fluids  and repeat ultrasound tomorrow or number two, to proceed with repeat  Cesarean section as she elected yesterday.  Risks, benefits and  alternatives were again reviewed with  patient including possible risk of aged placenta as well as difficulty  of fetal transition at delivery at 40 and 3/7 weeks.  Benefits were also  discussed as well as other alternatives and following this discussion  with Dr. Estanislado Pandy patient and significant other do elect to continue and  proceed with delivery today under repeat Cesarean section.      Candice [place], [place]      ______________________________  Crist Fat Rivard, M.D.    CHS/MEDQ  D:  01/13/2008  T:  01/13/2008  Job:  [id number]

## 2010-06-09 NOTE — Op Note (Signed)
NAME:  [name], [name]                        ACCOUNT NO.:  [id number]   MEDICAL RECORD NO.:  [id number]                   PATIENT TYPE:  AMB   LOCATION:  SDC                                  [facility]   PHYSICIAN:  Phil D. Okey Dupre, M.D.                  DATE OF BIRTH:  06/18/1980   DATE OF PROCEDURE:  05/18/2002  DATE OF DISCHARGE:                                 OPERATIVE REPORT   PREOPERATIVE DIAGNOSIS:  Cervical intraepithelial neoplasia III.   POSTOPERATIVE DIAGNOSIS:  Pending pathology report.   PROCEDURE:  Cold knife conization of the cervix.   SURGEON:  Javier Glazier. Okey Dupre, M.D.   ANESTHESIA:  General.   FINDINGS:  Markedly hypertrophic cervix with a large area of ectropionic  growth but very little nonstaining area when Lugol's solution was applied.   DESCRIPTION OF PROCEDURE:  Under satisfactory general anesthesia, the  patient in dorsal lithotomy position, perineum and vagina prepped and draped  in the usual sterile manner.  Bimanual pelvic examination under anesthesia  revealed the uterus was normal size, shape, and consistency, anteflexed and  freely movable.  A weighted speculum was placed to the fourchette of the  vagina and a Deaver retractor used just under the bladder to expose the  cervix, which was grasped with a single-tooth tenaculum.  Chromic #1 sutures  were placed in laterally on each side of the cervix approximately 0.5 cm  deep into that area, the midportion tied down as a figure-of-eight for  hemostasis and lateral traction.  At that point a straight scalpel blade was  brought around the outside of the entire circumference of the transition  zone to a depth of 0.5 cm.  The anterior-posterior lips of the site of the  lower portion of the future conization was grasped with Allis clamp and  using an angled 11 blade, a cervical cone was dissected away from the area  using the previous circumferential incision as a guide.  The 12 o'clock  position was then  tagged with a suture to be sent for pathologic diagnosis.  There was a small area at 8 o'clock that was ______ cut off of the main cone  area, and this was removed once again with sharp dissection.  Once this was  done, the entire cuff was run with a continuous running locked #1 chromic  suture on an atraumatic needle and the base of the cone coagulated with hot  cautery.  The area was observed and there was no bleeding noted; however,  large tampons with Monsel's solution were packed against the biopsy site,  which had been injected with 1% Xylocaine and 1:100,000 epinephrine, a total  of about 8 mL around the entire operative site.  The lateral sutures that  were used for traction were then cut short.  The area was observed for  bleeding.  None was noted.  The patient transferred to the  recovery room in  satisfactory condition with a 150 mL blood loss.                                               Phil D. Okey Dupre, M.D.    PDR/MEDQ  D:  05/18/2002  T:  05/19/2002  Job:  [id number]

## 2010-06-09 NOTE — Discharge Summary (Signed)
[place]. [facility]  Patient:    [name], [name]                      MRN: [id number] Adm. Date:  [id number] Disc. Date: [id number] Attending:  Madaline Guthrie Dictator:   Raynelle Jan CC:         Raynelle Jan, M.D.             Farley Ly, M.D.                           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  DISCHARGE MEDICATIONS: Pepcid 20 mg p.o. b.i.d.  DISPOSITION:  She will follow up with Dr. Carolyne Fiscal on Friday in the [facility].  She will all the [facility] for an appointment when it opens on Monday.  HISTORY OF PRESENT ILLNESS:  The patient is an 30 year old who presents to the emergency department with a several day history of nausea and vomiting and abdominal pain.  Her illness initially started two weeks ago with a URI resulting in some mild dehydration; however, on the weekend prior to admission she had consumed large volumes of alcohol on Saturday, Sunday, and Monday, including multiple liquors, as well as marijuana usage.  The patient states she has had fever and chills over the last few weeks but no vaginal discharge or vaginal itching. No hematuria or dysuria.  No back or flank pain.  No constipation or diarrhea. She has noted some dark coffee-grounds emesis today; however, no bright red blood.  She denies any melena or blood in her stools.  Her last menstrual period was one week prior to admission and was a normal menses.  PAST MEDICAL HISTORY:  No chronic illnesses.  No hospitalizations or surgeries.  MEDICATIONS:  Her only medications include oral contraceptive pills.  ALLERGIES:  No known drug allergies.  PHYSICAL EXAMINATION:  VITAL SIGNS:  On admission in the ED her temperature was 98.0 degrees, blood pressure 131/72, pulse 62 and regular, respiratory rate 20.  GENERAL:  She was in no apparent distress.  CARDIOVASCULAR:  Normal S1 and S2 with regular rate and rhythm, with grade 2/6 systolic  flow murmur.  LUNGS:  Clear to auscultation bilaterally.  ABDOMEN:  Soft, tender in the right lower quadrant and left upper quadrant, with no rebound or guarding.  She had positive bowel sounds.  RECTAL:  Hemoccult negative, with hard stool in the vault, and normal sphincter  tone.  LABORATORY DATA:  Urine pregnancy test was negative.  WBC 11.8, hemoglobin 12.8, hematocrit 37.3; platelets 276,000.  Clean-catch urinalysis showed negative nitrites, small amount leukocyte esterase, many epithelial cells; 21-50 wbc/hpf; 6-10 rbc/hpf; few bacteria.  KUB showed a normal bowel gas pattern.  A comprehensive metabolic panel was okay with a sodium of 139, potassium 4, chloride 103, bicarbonate 25, glucose 114, BUN 7, creatinine 0.7.  Calcium 10.2, total protein 8.1, albumin 4.7, AST 22, ALT 17, alkaline phosphatase 61, total bilirubin 0.6.  Amylase 60, lipase 20.  Urine drug screen was positive for THC.  A repeat  catheterized urinalysis was positive for ketones.  HOSPITAL COURSE:  Initially the patient was kept NPO with only ice chips given s we monitored her abdominal examination.  On April 07, 1999 her temperature maximum was 99.6 degrees.  Her WBC had risen to 13.  She persistently had nausea and vomiting and mild epigastric tenderness and therefore a CT of  the abdomen was obtained, which showed a small amount of physiologic free fluid in the pelvis, ut no further abnormalities noted.  Throughout the 24 hours prior to her discharge her diet was advanced as tolerated.  Serial abdominal examinations were performed and her nausea and vomiting had lessened.  On the day of discharge she had been without nausea and vomiting for 24 hours and she was able to tolerate a regular diet. he was afebrile.  Her heart had a regular rate and rhythm, the lungs were clear. er abdomen was soft, nontender, nondistended, with positive bowel sounds.  Her epigastric tenderness had completely  resolved.  Her basic metabolic panel at the time of discharge was within normal limits.  Therefore she was discharged home after counseling her that if she continued to drink the amount of alcohol that he has been drinking it would have a detrimental effect on her health.  The patient was also counseled as to the dangers of drug use. DD:  04/09/99 TD:  04/10/99 Job: [id number]

## 2010-06-09 NOTE — Discharge Summary (Signed)
NAME:  [name], [name] NO.:  [id number]   MEDICAL RECORD NO.:  [id number]          PATIENT TYPE:  WOC   LOCATION:  WOC                          FACILITY:  [facility]   PHYSICIAN:  Conni Elliot, M.D.DATE OF BIRTH:  25-Apr-1980   DATE OF ADMISSION:  02/24/2004  DATE OF DISCHARGE:  02/29/2004                                 DISCHARGE SUMMARY   ADMISSION DIAGNOSES:  74.  30 year old gravida 1, at 40 weeks with nonreactive fetal heart rate      tracing.  2.  History of cold knife conization of the cervix.   DISCHARGE DIAGNOSES:  22.  30 year old gravida 1, para 1-0-0-1, postoperative day #3, status post      low transverse cesarean section for nonreassuring fetal heart rate      tracing.  2.  Viable infant female with Apgars 8 and 8.   DISCHARGE MEDICATIONS:  1.  Percocet 5/325 one to two p.o. q.4-6h p.r.n. #30 with no refills.  2.  Ibuprofen 600 mg one p.o. q.6h p.r.n.  3.  Micronor one p.o. daily as directed.  4.  Prenatal vitamins one p.o. daily.   HISTORY OF PRESENT ILLNESS:  [name] was admitted from antenatal testing  for nonreactive strip at [redacted] weeks gestation.  She was induced with Cervidil.  She received two Cervidils, one Cytotec, and was augmented with Pitocin  after her cervix was ripened.  On Pitocin, she began to have late  decelerations on hospital day #2.  She was taken to the operating room.  Please see the operative note.   HOSPITAL COURSE:  Her postoperative course was uncomplicated.  Her  postoperative hemoglobin was 10.5 down from 12.8.  She was asymptomatic.   The patient was discharged to home in stable condition.   DISCHARGE INSTRUCTIONS:  The patient was told of the above medical regimen.  She will [facility] with [facility].  Her staples were  removed prior to discharge.      LC/MEDQ  D:  02/29/2004  T:  02/29/2004  Job:  [id number]   cc:   [facility], [employer]

## 2010-06-09 NOTE — Op Note (Signed)
NAME:  [name], [name] NO.:  [id number]   MEDICAL RECORD NO.:  [id number]          PATIENT TYPE:  WOC   LOCATION:  WOC                          FACILITY:  [facility]   PHYSICIAN:  Phil D. Okey Dupre, M.D.     DATE OF BIRTH:  [birth year]   DATE OF PROCEDURE:  02/26/2004  DATE OF DISCHARGE:                                 OPERATIVE REPORT   PROCEDURE:  Low transverse cesarean section.   PREOPERATIVE DIAGNOSIS:  Unreassuring fetal heart pattern.   POSTOPERATIVE DIAGNOSIS:  Unreassuring fetal heart pattern with a direct  posterior presentation.   SURGEON:  Dr. Elsie Lincoln   FIRST ASSISTANT:  Dr. Marcelino Freestone BLOOD LOSS:  500 mL.   Procedure went as follows:  Under satisfactory spinal anesthesia, the  patient in a dorsal supine position, Foley catheter in urinary bladder, the  abdomen was prepped and draped in the usual sterile manner and entered  through a transverse suprapubic incision situated 3 cm above the symphysis  pubis and extended for a total length of 18 cm.  The abdomen was entered by  layers.  On entry into the peritoneal cavity, the visceral peritoneum and  the anterior surface of the uterus was opened transversely, the bladder  pushed away from the lower uterine segment which was entered by sharp and  blunt dissection and from a direct posterior presentation, the baby was  easily delivered, the cord doubly clamped, divided, baby handed to  pediatrician, samples of blood taken from the cord for analysis.  Placenta  spontaneously removed, the uterus explored and then closed with a continuous  running 0 Vicryl in an atraumatic needle.  Tape, instrument, sponge, and  needle report were correct at this point, and there was no sign of any  intraperitoneal bleeding.  The fascia was closed with a continuous running 0  Vicryl suture in an atraumatic needle.  Subcutaneous bleeders were  controlled with hot cautery and skin staples used for skin edge  approximation.  Total blood loss 500 mL.  The patient tolerated the  procedure well and was transferred to the recovery room in satisfactory  condition with a Foley catheter draining clear urine.      PDR/MEDQ  D:  02/26/2004  T:  02/26/2004  Job:  [id number]

## 2010-10-19 LAB — URINALYSIS, ROUTINE W REFLEX MICROSCOPIC
Glucose, UA: NEGATIVE
Hgb urine dipstick: NEGATIVE
Ketones, ur: NEGATIVE
Protein, ur: NEGATIVE
pH: 7

## 2010-10-19 LAB — WET PREP, GENITAL: Trich, Wet Prep: NONE SEEN

## 2010-10-19 LAB — CBC
Hemoglobin: 12.3
Platelets: 214
RBC: 3.98

## 2010-10-19 LAB — HCG, QUANTITATIVE, PREGNANCY: hCG, Beta Chain, Quant, S: 51131 — ABNORMAL HIGH

## 2010-10-19 LAB — GC/CHLAMYDIA PROBE AMP, GENITAL
Chlamydia, DNA Probe: NEGATIVE
GC Probe Amp, Genital: NEGATIVE

## 2010-10-26 LAB — CBC
HCT: 30.9 % — ABNORMAL LOW (ref 36.0–46.0)
HCT: 32.3 % — ABNORMAL LOW (ref 36.0–46.0)
HCT: 34.5 % — ABNORMAL LOW (ref 36.0–46.0)
Hemoglobin: 10.9 g/dL — ABNORMAL LOW (ref 12.0–15.0)
Hemoglobin: 11.8 g/dL — ABNORMAL LOW (ref 12.0–15.0)
MCHC: 33.8 g/dL (ref 30.0–36.0)
MCHC: 34.1 g/dL (ref 30.0–36.0)
MCV: 92 fL (ref 78.0–100.0)
MCV: 92.4 fL (ref 78.0–100.0)
MCV: 92.9 fL (ref 78.0–100.0)
RDW: 14.5 % (ref 11.5–15.5)
WBC: 13.4 10*3/uL — ABNORMAL HIGH (ref 4.0–10.5)

## 2010-10-26 LAB — BASIC METABOLIC PANEL
CO2: 19 mEq/L (ref 19–32)
Calcium: 9.1 mg/dL (ref 8.4–10.5)
Chloride: 102 mEq/L (ref 96–112)
Creatinine, Ser: 0.42 mg/dL (ref 0.4–1.2)

## 2010-10-26 LAB — URIC ACID: Uric Acid, Serum: 5 mg/dL (ref 2.4–7.0)

## 2010-10-26 LAB — GLUCOSE, CAPILLARY: Glucose-Capillary: 89 mg/dL (ref 70–99)

## 2010-10-26 LAB — COMPREHENSIVE METABOLIC PANEL
Alkaline Phosphatase: 75 U/L (ref 39–117)
BUN: 5 mg/dL — ABNORMAL LOW (ref 6–23)
Chloride: 104 mEq/L (ref 96–112)
Creatinine, Ser: 0.57 mg/dL (ref 0.4–1.2)
Glucose, Bld: 87 mg/dL (ref 70–99)
Potassium: 3.5 mEq/L (ref 3.5–5.1)
Total Bilirubin: 0.4 mg/dL (ref 0.3–1.2)
Total Protein: 5.8 g/dL — ABNORMAL LOW (ref 6.0–8.3)

## 2010-10-26 LAB — URINALYSIS, ROUTINE W REFLEX MICROSCOPIC
Bilirubin Urine: NEGATIVE
Ketones, ur: NEGATIVE mg/dL
Nitrite: NEGATIVE
Urobilinogen, UA: 0.2 mg/dL (ref 0.0–1.0)

## 2010-10-26 LAB — LACTATE DEHYDROGENASE: LDH: 163 U/L (ref 94–250)

## 2010-10-26 LAB — CCBB MATERNAL DONOR DRAW

## 2010-10-31 LAB — I-STAT 8, (EC8 V) (CONVERTED LAB)
Acid-Base Excess: 2
Bicarbonate: 26.7 — ABNORMAL HIGH
Glucose, Bld: 96
Operator id: 126491
Sodium: 140
TCO2: 28
pCO2, Ven: 43.1 — ABNORMAL LOW
pH, Ven: 7.4 — ABNORMAL HIGH

## 2012-07-01 ENCOUNTER — Emergency Department (INDEPENDENT_AMBULATORY_CARE_PROVIDER_SITE_OTHER)
Admission: EM | Admit: 2012-07-01 | Discharge: 2012-07-01 | Disposition: A | Discharge status: Home / Self Care | Payer: BC Managed Care – PPO | Source: Home / Self Care | Attending: Emergency Medicine | Admitting: Emergency Medicine

## 2012-07-01 ENCOUNTER — Encounter (HOSPITAL_COMMUNITY): Payer: Self-pay | Admitting: Emergency Medicine

## 2012-07-01 ENCOUNTER — Other Ambulatory Visit: Payer: Self-pay

## 2012-07-01 DIAGNOSIS — R42 Dizziness and giddiness: Secondary | ICD-10-CM

## 2012-07-01 DIAGNOSIS — R55 Syncope and collapse: Secondary | ICD-10-CM

## 2012-07-01 LAB — POCT URINALYSIS DIP (DEVICE)
Bilirubin Urine: NEGATIVE
Glucose, UA: NEGATIVE mg/dL
Hgb urine dipstick: NEGATIVE
Ketones, ur: NEGATIVE mg/dL
Leukocytes, UA: NEGATIVE
Nitrite: NEGATIVE
pH: 6.5 (ref 5.0–8.0)

## 2012-07-01 LAB — POCT I-STAT, CHEM 8
BUN: 7 mg/dL (ref 6–23)
Calcium, Ion: 1.23 mmol/L (ref 1.12–1.23)
Chloride: 102 mEq/L (ref 96–112)
Glucose, Bld: 97 mg/dL (ref 70–99)
HCT: 40 % (ref 36.0–46.0)
Potassium: 3.6 mEq/L (ref 3.5–5.1)

## 2012-07-01 LAB — POCT PREGNANCY, URINE: Preg Test, Ur: NEGATIVE

## 2012-07-01 NOTE — ED Notes (Addendum)
Pt c/o dizziness onset yesterday. Felt really dizzy and like she was about to pass out so she lowered herself to the floor. Husband reports she was speaking nonsense and on all fours on the floor when he came in. Pt reports no LOC but room was dark and childrens voices began to fade. Felt better this morning but still has pressure in the back of her head and dizziness when she stands.  Patient also reports last menstrual cycle was irregular and light. Did not have menstrual period for 2 months prior. Took two pregnancy test and both were negative. Patient is alert and oriented.

## 2012-07-01 NOTE — ED Provider Notes (Signed)
History     CSN: [id number]  Arrival date & time 07/01/12  1420   None     Chief Complaint  Patient presents with  . Dizziness    (Consider location/radiation/quality/duration/timing/severity/associated sxs/prior treatment) HPI Comments: 32 year old female presents for evaluation of dizziness and a fainting episode that occurred yesterday afternoon. For 2 days, she has been having generalized symptoms with cough, fatigue, sore throat, and malaise. Yesterday morning she started to feel a pressure sensation in the back of her neck. She was standing in her kitchen when she started to feel like her vision was going dark. She has had episodes of fainting before so she recognized this feeling and lay down on the couch. After she laid down she felt her vision go dark. Her husband with her at the time and told related that for about 30 minutes she was talking "nonsense". Since this happened, she has had intermittent dizziness is worse when she goes from a lying or sitting to a standing position. The dizziness is associated with nausea, without vomiting. She denies chest pain, palpitations, dark stools, abdominal pain, heavy periods, headache, or rash. She does note that she is on 2 months without a period which is unusual for her. She took 2 pregnancy tests at home that were both negative.    History reviewed. No pertinent past medical history.  History reviewed. No pertinent past surgical history.  History reviewed. No pertinent family history.  History  Substance Use Topics  . Smoking status: Never Smoker   . Smokeless tobacco: Not on file  . Alcohol Use: Yes     Comment: occasionally    OB History   Grav Para Term Preterm Abortions TAB SAB Ect Mult Living                  Review of Systems  Constitutional: Negative for fever and chills.  HENT: Positive for neck pain (pressure). Negative for nosebleeds and neck stiffness.   Eyes: Negative for visual disturbance.  Respiratory:  Negative for cough and shortness of breath.   Cardiovascular: Negative for chest pain, palpitations and leg swelling.  Gastrointestinal: Positive for nausea. Negative for vomiting, abdominal pain, diarrhea, constipation, blood in stool and anal bleeding.  Endocrine: Negative for polydipsia and polyuria.  Genitourinary: Negative for dysuria, urgency, frequency, vaginal bleeding, vaginal discharge and vaginal pain.  Musculoskeletal: Negative for myalgias and arthralgias.  Skin: Negative for rash.  Neurological: Positive for dizziness and light-headedness. Negative for seizures, weakness, numbness and headaches.    Allergies  Review of patient's allergies indicates no known allergies.  Home Medications  No current outpatient prescriptions on file.  BP 137/76  Pulse 79  Resp 16  SpO2 95%  LMP 06/27/2012  Physical Exam  Nursing note and vitals reviewed. Constitutional: She is oriented to person, place, and time. Vital signs are normal. She appears well-developed and well-nourished. No distress.  HENT:  Head: Atraumatic.  Eyes: EOM are normal. Pupils are equal, round, and reactive to light.  Cardiovascular: Normal rate, regular rhythm and normal heart sounds.  Exam reveals no gallop and no friction rub.   No murmur heard. Pulmonary/Chest: Effort normal and breath sounds normal. No respiratory distress. She has no wheezes. She has no rales.  Abdominal: Soft. There is no tenderness.  Neurological: She is alert and oriented to person, place, and time. She has normal strength. No cranial nerve deficit or sensory deficit.  Skin: Skin is warm and dry. She is not diaphoretic.  Psychiatric: She has  a normal mood and affect. Her behavior is normal. Judgment normal.    ED Course  Procedures (including critical care time)  Labs Reviewed  POCT URINALYSIS DIP (DEVICE) - Abnormal; Notable for the following:    Protein, ur 30 (*)    All other components within normal limits  POCT I-STAT, CHEM  8  POCT PREGNANCY, URINE   No results found.   1. Dizziness   2. Syncopal episodes       MDM  Negative workup here today for any reversible causes of syncope or dizziness. Her story sounds more like a seizure event than true syncope. Patient is instructed to call neurology to make a followup appointment. However, should this happen again, she is instructed to call 911 or get a ride to the emergency department. She has been instructed to not drive her car or operate any equipment until after she has been cleared by neurology.        Graylon Good, PA-C 07/01/12 1623

## 2012-07-01 NOTE — ED Provider Notes (Signed)
Medical screening examination/treatment/procedure(s) were performed by non-physician practitioner and as supervising physician I was immediately available for consultation/collaboration.  Leslee Home, M.D.  Reuben Likes, MD 07/01/12 2155

## 2012-07-24 ENCOUNTER — Other Ambulatory Visit: Payer: Self-pay | Admitting: Obstetrics and Gynecology

## 2013-02-11 ENCOUNTER — Encounter (HOSPITAL_COMMUNITY): Payer: Self-pay | Admitting: Emergency Medicine

## 2013-02-11 ENCOUNTER — Emergency Department (INDEPENDENT_AMBULATORY_CARE_PROVIDER_SITE_OTHER): Payer: Self-pay

## 2013-02-11 ENCOUNTER — Emergency Department (INDEPENDENT_AMBULATORY_CARE_PROVIDER_SITE_OTHER)
Admission: EM | Admit: 2013-02-11 | Discharge: 2013-02-11 | Disposition: A | Discharge status: Nursing Home (Includes ICF) | Payer: Self-pay | Source: Home / Self Care | Attending: Emergency Medicine | Admitting: Emergency Medicine

## 2013-02-11 ENCOUNTER — Inpatient Hospital Stay (HOSPITAL_COMMUNITY)
Admission: EM | Admit: 2013-02-11 | Discharge: 2013-02-13 | DRG: 558 | Disposition: A | Discharge status: Home / Self Care | Payer: Self-pay | Attending: Internal Medicine | Admitting: Internal Medicine

## 2013-02-11 DIAGNOSIS — M542 Cervicalgia: Secondary | ICD-10-CM

## 2013-02-11 DIAGNOSIS — Z8249 Family history of ischemic heart disease and other diseases of the circulatory system: Secondary | ICD-10-CM

## 2013-02-11 DIAGNOSIS — K219 Gastro-esophageal reflux disease without esophagitis: Secondary | ICD-10-CM | POA: Diagnosis present

## 2013-02-11 DIAGNOSIS — I10 Essential (primary) hypertension: Secondary | ICD-10-CM | POA: Diagnosis present

## 2013-02-11 DIAGNOSIS — R1310 Dysphagia, unspecified: Secondary | ICD-10-CM | POA: Diagnosis present

## 2013-02-11 DIAGNOSIS — I4891 Unspecified atrial fibrillation: Secondary | ICD-10-CM

## 2013-02-11 DIAGNOSIS — Z8632 Personal history of gestational diabetes: Secondary | ICD-10-CM

## 2013-02-11 DIAGNOSIS — M6580 Other synovitis and tenosynovitis, unspecified site: Principal | ICD-10-CM | POA: Diagnosis present

## 2013-02-11 DIAGNOSIS — Z833 Family history of diabetes mellitus: Secondary | ICD-10-CM

## 2013-02-11 DIAGNOSIS — J390 Retropharyngeal and parapharyngeal abscess: Secondary | ICD-10-CM

## 2013-02-11 HISTORY — DX: Gestational diabetes mellitus in pregnancy, unspecified control: O24.419

## 2013-02-11 HISTORY — DX: Essential (primary) hypertension: I10

## 2013-02-11 HISTORY — DX: Retropharyngeal and parapharyngeal abscess: J39.0

## 2013-02-11 HISTORY — DX: Other complications of anesthesia, initial encounter: T88.59XA

## 2013-02-11 HISTORY — DX: Adverse effect of unspecified anesthetic, initial encounter: T41.45XA

## 2013-02-11 HISTORY — DX: Gastro-esophageal reflux disease without esophagitis: K21.9

## 2013-02-11 HISTORY — DX: Other specified postprocedural states: R11.2

## 2013-02-11 HISTORY — DX: Nausea with vomiting, unspecified: Z98.890

## 2013-02-11 HISTORY — DX: Unspecified atrial fibrillation: I48.91

## 2013-02-11 HISTORY — DX: Other specified postprocedural states: Z98.890

## 2013-02-11 LAB — CBC WITH DIFFERENTIAL/PLATELET
BASOS ABS: 0 10*3/uL (ref 0.0–0.1)
Basophils Relative: 0 % (ref 0–1)
EOS PCT: 2 % (ref 0–5)
Eosinophils Absolute: 0.3 10*3/uL (ref 0.0–0.7)
HCT: 40.8 % (ref 36.0–46.0)
Hemoglobin: 13.4 g/dL (ref 12.0–15.0)
LYMPHS PCT: 31 % (ref 12–46)
Lymphs Abs: 4.9 10*3/uL — ABNORMAL HIGH (ref 0.7–4.0)
MCH: 29.6 pg (ref 26.0–34.0)
MCHC: 32.8 g/dL (ref 30.0–36.0)
MCV: 90.3 fL (ref 78.0–100.0)
Monocytes Absolute: 1.4 10*3/uL — ABNORMAL HIGH (ref 0.1–1.0)
Monocytes Relative: 9 % (ref 3–12)
NEUTROS ABS: 9.4 10*3/uL — AB (ref 1.7–7.7)
Neutrophils Relative %: 59 % (ref 43–77)
PLATELETS: 258 10*3/uL (ref 150–400)
RBC: 4.52 MIL/uL (ref 3.87–5.11)
RDW: 13 % (ref 11.5–15.5)
WBC: 16 10*3/uL — AB (ref 4.0–10.5)

## 2013-02-11 LAB — COMPREHENSIVE METABOLIC PANEL
ALBUMIN: 3.9 g/dL (ref 3.5–5.2)
ALT: 10 U/L (ref 0–35)
AST: 11 U/L (ref 0–37)
Alkaline Phosphatase: 65 U/L (ref 39–117)
BUN: 10 mg/dL (ref 6–23)
CALCIUM: 9.2 mg/dL (ref 8.4–10.5)
CHLORIDE: 103 meq/L (ref 96–112)
CO2: 23 mEq/L (ref 19–32)
Creatinine, Ser: 0.53 mg/dL (ref 0.50–1.10)
GFR calc Af Amer: >90 mL/min (ref >90)
Glucose, Bld: 104 mg/dL — ABNORMAL HIGH (ref 70–99)
Potassium: 4.2 mEq/L (ref 3.7–5.3)
SODIUM: 141 meq/L (ref 137–147)
Total Bilirubin: 0.2 mg/dL — ABNORMAL LOW (ref 0.3–1.2)
Total Protein: 7.9 g/dL (ref 6.0–8.3)

## 2013-02-11 LAB — TROPONIN I: Troponin I: <0.3 ng/mL (ref <0.30)

## 2013-02-11 MED ORDER — HYDROCODONE-ACETAMINOPHEN 5-325 MG PO TABS
ORAL_TABLET | ORAL | Status: AC
  Filled 2013-02-11: qty 2

## 2013-02-11 MED ORDER — HYDROCODONE-ACETAMINOPHEN 5-325 MG PO TABS
2.0000 | ORAL_TABLET | Freq: Once | ORAL | Status: AC
  Administered 2013-02-11: 2 via ORAL

## 2013-02-11 NOTE — ED Notes (Signed)
Pt was transferred from [facility].  She has had a stiff neck since yesterday with lt throat pain and a rapid heart beat when she rolls onto her lr side since last pm.  She had an ekg at [facility] and was found to be in af.  No previous history.  No recent injury.  She has a history of neck stiffness

## 2013-02-11 NOTE — ED Notes (Signed)
Reports pain in neck x some time, some better w stretching; worse since yesterday

## 2013-02-11 NOTE — Discharge Instructions (Signed)
We have determined that your problem requires further evaluation in the emergency department.  We will take care of your transport there.  Once at the emergency department, you will be evaluated by a provider and they will order whatever treatment or tests they deem necessary.  We cannot guarantee that they will do any specific test or do any specific treatment.  ° °

## 2013-02-11 NOTE — ED Notes (Signed)
Attempted venipuncture on lt posterior hand; unsuccessful.

## 2013-02-11 NOTE — ED Provider Notes (Signed)
Chief Complaint:   Chief Complaint  Patient presents with  . Neck Pain    History of Present Illness:   [name] is a 33 year old female who presents tonight with a two-day history of severe neck pain and heart palpitations. The patient states she's had minor neck pain off-and-on for the past 2 months, but over the past 2 days the neck pain became much more severe. Is localized to the base of the skull and radiates down both trapezius ridges, although she states is worse on the left than the right and hurts first thing in the morning and if she swallows. Because of that she's not been able he much of anything. It's better with massaging the neck. The pain does not radiate down the arms and there is no numbness, tingling, weakness in the upper extremities. The patient is felt hot, dizzy, and chills. She denies any definite fever. She does note some sore throat and odynophagia. She also notes her heart races particularly when she lies on her left side. She's had some crampy pain in both flank areas off and on. She denies any injury to the neck. She does not have any chest pain or shortness of breath. She has had no syncope. Neck hurts worse with any movement, even with chewing and swallowing.  She does not have any history of atrial fibrillation or any cardiac problems in the past.  Review of Systems:  Other than noted above, the patient denies any of the following symptoms: Constitutional:  No fever, chills, or sweats. ENT:  No nasal congestion, sore throat, or oral ulcerations or lesions. Neck:  No swelling, or adenopathy.  Full ROM without pain. Cardiac:  No chest pain, tightness, or pressure. Respiratory:  No cough, wheezing, or dyspnea. M-S:  No joint pain, muscle pain, or back problems. Neuro:  No muscle weakness, numbness or paresthesias.  PMFSH:  Past medical history, family history, social history, meds, and allergies were reviewed.   Physical Exam:   Vital signs:  BP 141/98   Pulse 80  Temp(Src) 99.2 F (37.3 C) (Oral)  Resp 14  SpO2 98%  LMP 02/11/2013 General:  Alert, oriented and in no distress. Eye:  PERRL, full EOMs. ENT:  Tonsils were enlarged and erythematous with no exudate, no oral lesions. Neck:  Extreme tenderness to palpation over the anterior cervical triangles bilaterally, left worse than right. There was no mass, no adenopathy, no palpable abnormality. Her neck has a very limited range of motion in all directions with extreme pain and muscle spasm. There is no pain posteriorly or over the trapezius ridges. Lungs:  No respiratory distress.  Breath sounds clear and equal bilaterally.  No wheezes, rales or rhonchi. Heart:  Cardiac rhythm is irregularly irregular.  No gallops, murmers, or rubs. Ext:  No upper extremity edema, pulses full.  Full ROM of joints with no joint or muscle pain to palpation. Neuro:  Alert and oriented times 3.  No focal muscle weakness.  DTRs symmetric.  Sensation intact to light touch. Skin: Clear, warm and dry.  No rash.  Good capillary refill.  Radiology:  Dg Cervical Spine Complete  02/11/2013   CLINICAL DATA:  Neck pain since yesterday morning. Swelling of left-sided neck. No injury.  EXAM: CERVICAL SPINE  4+ VIEWS  COMPARISON:  None.  FINDINGS: The lateral view images through the mid C7 level. Prevertebral soft tissues are within normal limits. Mild straightening and reversal of expected cervical lordosis about the C5-6 level. Maintenance of vertebral  body height.  Swimmer's view is suboptimal. The C7-T1 level is grossly within normal limits.  Facets are well-aligned. Suspect mild neural foraminal narrowing at C4-5 and C5-6 on the right. C4-5 on the left. Minimal endplate osteophyte formation at C4-5 and C5-6. Open-mouth view suboptimal without displaced odontoid fracture.  IMPRESSION: Mild spondylosis, without acute osseous abnormality.  Mild nonspecific straightening and reversal of expected cervical lordosis.   Electronically  Signed   By: Jeronimo Greaves M.D.   On: 02/11/2013 19:38   EKG Results    Date: 02/11/2013  Rate: 82  Rhythm: atrial fibrillation  QRS Axis: normal  Intervals: normal  ST/T Wave abnormalities: normal  Conduction Disutrbances:none  Narrative Interpretation: Atrial fibrillation. This is new onset.  Old EKG Reviewed: none available  Course in Urgent Care Center:   Given Norco 5/325 2 by mouth for pain.  Assessment:  The primary encounter diagnosis was Neck pain. A diagnosis of Atrial fibrillation was also pertinent to this visit.  She has new onset atrial fibrillation. The cause of her neck pain remains an enigma. This may be muscle strain or muscle spasm, but the fact that she is tender anteriorly with pain on swallowing makes me wonder about a retropharyngeal abscess or septic thrombophlebitis of the jugular vein. I think she needs further workup for the pain including a soft tissue CT of the neck and a CT angiogram of the jugular veins.  Plan:    The patient was transferred to the ED via shuttle in stable condition.  Medical Decision Making     33 year old female presents with 2 day history of severe left neck pain, swelling, and pain with movement.  No injury.  Hurts to swallow and she does have a severe sore throat.  She also describes  Heart fluttering but only when she lies on her left side.  Denies any fever, but does have chills, feels hot and cold and dizzy.  On exam she is extremely tender anteriorly.  No mass or adenopathy.  Her cardiac rhythm is irregular.  EKG shows atrial fib with controlled ventricular rate--this is new for her.  No prior history of atrial fib or any cardiac problem.  Her C-spine film shows reversal of normal lumbar lordosis but otherwise is normal.  I am transferring her due to new onset atrial fib and unexplained severe neck pain.  I am concerned about septic thrombophlebitis of jugular vein (Lemierr's syndrom), and I think she needs a  Neck CT and CT  angiogram.       Reuben Likes, MD 02/11/13 2006

## 2013-02-12 ENCOUNTER — Inpatient Hospital Stay (HOSPITAL_COMMUNITY): Payer: No Typology Code available for payment source

## 2013-02-12 ENCOUNTER — Emergency Department (HOSPITAL_COMMUNITY): Payer: No Typology Code available for payment source

## 2013-02-12 ENCOUNTER — Encounter (HOSPITAL_COMMUNITY): Payer: Self-pay | Admitting: Radiology

## 2013-02-12 DIAGNOSIS — J390 Retropharyngeal and parapharyngeal abscess: Secondary | ICD-10-CM

## 2013-02-12 DIAGNOSIS — I4891 Unspecified atrial fibrillation: Secondary | ICD-10-CM

## 2013-02-12 DIAGNOSIS — I369 Nonrheumatic tricuspid valve disorder, unspecified: Secondary | ICD-10-CM

## 2013-02-12 LAB — COMPREHENSIVE METABOLIC PANEL
ALT: 9 U/L (ref 0–35)
AST: 11 U/L (ref 0–37)
Albumin: 3.8 g/dL (ref 3.5–5.2)
Alkaline Phosphatase: 65 U/L (ref 39–117)
BUN: 6 mg/dL (ref 6–23)
CHLORIDE: 104 meq/L (ref 96–112)
CO2: 24 mEq/L (ref 19–32)
Calcium: 9 mg/dL (ref 8.4–10.5)
Creatinine, Ser: 0.52 mg/dL (ref 0.50–1.10)
GFR calc Af Amer: >90 mL/min (ref >90)
GFR calc non Af Amer: >90 mL/min (ref >90)
GLUCOSE: 121 mg/dL — AB (ref 70–99)
POTASSIUM: 4 meq/L (ref 3.7–5.3)
SODIUM: 141 meq/L (ref 137–147)
TOTAL PROTEIN: 7.9 g/dL (ref 6.0–8.3)
Total Bilirubin: 0.3 mg/dL (ref 0.3–1.2)

## 2013-02-12 LAB — URINALYSIS, ROUTINE W REFLEX MICROSCOPIC
Bilirubin Urine: NEGATIVE
GLUCOSE, UA: NEGATIVE mg/dL
Ketones, ur: NEGATIVE mg/dL
LEUKOCYTES UA: NEGATIVE
Nitrite: NEGATIVE
Protein, ur: NEGATIVE mg/dL
SPECIFIC GRAVITY, URINE: 1.031 — AB (ref 1.005–1.030)
Urobilinogen, UA: 0.2 mg/dL (ref 0.0–1.0)
pH: 6.5 (ref 5.0–8.0)

## 2013-02-12 LAB — PREGNANCY, URINE: PREG TEST UR: NEGATIVE

## 2013-02-12 LAB — CBC WITH DIFFERENTIAL/PLATELET
Basophils Absolute: 0 10*3/uL (ref 0.0–0.1)
Basophils Relative: 0 % (ref 0–1)
EOS ABS: 0.3 10*3/uL (ref 0.0–0.7)
Eosinophils Relative: 2 % (ref 0–5)
HCT: 37.6 % (ref 36.0–46.0)
HEMOGLOBIN: 12.5 g/dL (ref 12.0–15.0)
LYMPHS ABS: 4.7 10*3/uL — AB (ref 0.7–4.0)
Lymphocytes Relative: 34 % (ref 12–46)
MCH: 30 pg (ref 26.0–34.0)
MCHC: 33.2 g/dL (ref 30.0–36.0)
MCV: 90.4 fL (ref 78.0–100.0)
MONO ABS: 1.2 10*3/uL — AB (ref 0.1–1.0)
MONOS PCT: 9 % (ref 3–12)
Neutro Abs: 7.7 10*3/uL (ref 1.7–7.7)
Neutrophils Relative %: 55 % (ref 43–77)
Platelets: 233 10*3/uL (ref 150–400)
RBC: 4.16 MIL/uL (ref 3.87–5.11)
RDW: 13 % (ref 11.5–15.5)
WBC: 14 10*3/uL — ABNORMAL HIGH (ref 4.0–10.5)

## 2013-02-12 LAB — URINE MICROSCOPIC-ADD ON

## 2013-02-12 LAB — T4, FREE: Free T4: 1.01 ng/dL (ref 0.80–1.80)

## 2013-02-12 LAB — GLUCOSE, CAPILLARY
GLUCOSE-CAPILLARY: 106 mg/dL — AB (ref 70–99)
GLUCOSE-CAPILLARY: 154 mg/dL — AB (ref 70–99)
GLUCOSE-CAPILLARY: 131 mg/dL — AB (ref 70–99)
Glucose-Capillary: 124 mg/dL — ABNORMAL HIGH (ref 70–99)

## 2013-02-12 LAB — TSH: TSH: 1.365 u[IU]/mL (ref 0.350–4.500)

## 2013-02-12 LAB — T3, FREE: T3, Free: 3 pg/mL (ref 2.3–4.2)

## 2013-02-12 LAB — RAPID STREP SCREEN (MED CTR MEBANE ONLY): Streptococcus, Group A Screen (Direct): NEGATIVE

## 2013-02-12 MED ORDER — SODIUM CHLORIDE 0.9 % IV SOLN
3.0000 g | Freq: Four times a day (QID) | INTRAVENOUS | Status: DC
  Administered 2013-02-12 (×2): 3 g via INTRAVENOUS
  Filled 2013-02-12 (×4): qty 3

## 2013-02-12 MED ORDER — SODIUM CHLORIDE 0.9 % IV SOLN
3.0000 g | Freq: Once | INTRAVENOUS | Status: AC
  Administered 2013-02-12: 3 g via INTRAVENOUS
  Filled 2013-02-12: qty 3

## 2013-02-12 MED ORDER — SODIUM CHLORIDE 0.9 % IV SOLN
3.0000 g | Freq: Three times a day (TID) | INTRAVENOUS | Status: DC
  Filled 2013-02-12 (×2): qty 3

## 2013-02-12 MED ORDER — IOHEXOL 350 MG/ML SOLN: 100.0000 mL | Freq: Once | INTRAVENOUS | Status: AC | PRN

## 2013-02-12 MED ORDER — IOHEXOL 300 MG/ML  SOLN
75.0000 mL | Freq: Once | INTRAMUSCULAR | Status: AC | PRN
  Administered 2013-02-12: 75 mL via INTRAVENOUS

## 2013-02-12 MED ORDER — ACETAMINOPHEN 650 MG RE SUPP: 650.0000 mg | Freq: Four times a day (QID) | RECTAL | Status: DC | PRN

## 2013-02-12 MED ORDER — SODIUM CHLORIDE 0.9 % IV SOLN
3.0000 g | Freq: Four times a day (QID) | INTRAVENOUS | Status: DC
  Administered 2013-02-13 (×2): 3 g via INTRAVENOUS
  Filled 2013-02-12 (×4): qty 3

## 2013-02-12 MED ORDER — MORPHINE SULFATE 4 MG/ML IJ SOLN
4.0000 mg | Freq: Once | INTRAMUSCULAR | Status: AC
  Administered 2013-02-12: 4 mg via INTRAVENOUS
  Filled 2013-02-12: qty 1

## 2013-02-12 MED ORDER — HEPARIN SODIUM (PORCINE) 5000 UNIT/ML IJ SOLN
5000.0000 [IU] | Freq: Three times a day (TID) | INTRAMUSCULAR | Status: DC
  Administered 2013-02-12 – 2013-02-13 (×3): 5000 [IU] via SUBCUTANEOUS
  Filled 2013-02-12 (×6): qty 1

## 2013-02-12 MED ORDER — DEXAMETHASONE SODIUM PHOSPHATE 10 MG/ML IJ SOLN
10.0000 mg | Freq: Two times a day (BID) | INTRAMUSCULAR | Status: DC
  Administered 2013-02-12 – 2013-02-13 (×3): 10 mg via INTRAVENOUS
  Filled 2013-02-12 (×4): qty 1

## 2013-02-12 MED ORDER — INSULIN ASPART 100 UNIT/ML ~~LOC~~ SOLN
0.0000 [IU] | Freq: Three times a day (TID) | SUBCUTANEOUS | Status: DC
  Administered 2013-02-12: 2 [IU] via SUBCUTANEOUS

## 2013-02-12 MED ORDER — SODIUM CHLORIDE 0.9 % IJ SOLN
3.0000 mL | Freq: Two times a day (BID) | INTRAMUSCULAR | Status: DC
  Administered 2013-02-12 – 2013-02-13 (×3): 3 mL via INTRAVENOUS

## 2013-02-12 MED ORDER — ONDANSETRON HCL 4 MG/2ML IJ SOLN: 4.0000 mg | Freq: Four times a day (QID) | INTRAMUSCULAR | Status: DC | PRN

## 2013-02-12 MED ORDER — SODIUM CHLORIDE 0.9 % IV SOLN
INTRAVENOUS | Status: AC
  Administered 2013-02-12 – 2013-02-13 (×2): via INTRAVENOUS

## 2013-02-12 MED ORDER — ACETAMINOPHEN 325 MG PO TABS
650.0000 mg | ORAL_TABLET | Freq: Four times a day (QID) | ORAL | Status: DC | PRN
  Administered 2013-02-12 – 2013-02-13 (×3): 650 mg via ORAL
  Filled 2013-02-12 (×3): qty 2

## 2013-02-12 MED ORDER — MORPHINE SULFATE 4 MG/ML IJ SOLN: 4.0000 mg | INTRAMUSCULAR | Status: DC | PRN

## 2013-02-12 MED ORDER — ONDANSETRON HCL 4 MG PO TABS: 4.0000 mg | ORAL_TABLET | Freq: Four times a day (QID) | ORAL | Status: DC | PRN

## 2013-02-12 NOTE — ED Notes (Signed)
[facility] Pt states it did not help her pain at all.

## 2013-02-12 NOTE — Progress Notes (Signed)
Utilization review completed. Breckan Cafiero, RN, BSN. 

## 2013-02-12 NOTE — Progress Notes (Signed)
Patient Demographics  [name], is a 33 y.o. female, DOB - 25-Jul-1980, WUJ:[id number]  Admit date - 02/11/2013   Admitting Physician Eduard ClosArshad N Kakrakandy, MD  Outpatient Primary MD for the patient is No PCP Per Patient  LOS - 1   Chief Complaint  Patient presents with  . transfer from ucc neck pain  af         Assessment & Plan    1. Longus Colli Tendinits - discussed with the ENT physician Dr. Gwyneth RevelsWollki who recommends continuation of IV Decadron along with Unasyn, patient is clinically improved, will place her on dysphagia 3 diet, protecting her airway without any problems.   2. New onset atrial fibrillation. CHDSvAsc 2 score of 0. Currently on telemetry, check echogram and TSH, we'll request cardiology consulted.        Code Status: Full  Family Communication: Husband  Disposition Plan: Home   Procedures CT neck   Consults  ENT, cardiology   Medications  Scheduled Meds: . ampicillin-sulbactam (UNASYN) IV  3 g Intravenous Q6H  . dexamethasone  10 mg Intravenous Q12H  . insulin aspart  0-9 Units Subcutaneous TID WC  . sodium chloride  3 mL Intravenous Q12H   Continuous Infusions: . sodium chloride 100 mL/hr at 02/12/13 0819   PRN Meds:.acetaminophen, acetaminophen, morphine injection, ondansetron (ZOFRAN) IV, ondansetron  DVT Prophylaxis   Heparin   Lab Results  Component Value Date   PLT 233 02/12/2013    Antibiotics     Anti-infectives   Start     Dose/Rate Route Frequency Ordered Stop   02/12/13 1200  Ampicillin-Sulbactam (UNASYN) 3 g in sodium chloride 0.9 % 100 mL IVPB  Status:  Discontinued     3 g 100 mL/hr over 60 Minutes Intravenous Every 8 hours 02/12/13 0635 02/12/13 0939   02/12/13 0945  Ampicillin-Sulbactam (UNASYN) 3 g in sodium chloride 0.9 % 100  mL IVPB     3 g 100 mL/hr over 60 Minutes Intravenous Every 6 hours 02/12/13 0939     02/12/13 0345  Ampicillin-Sulbactam (UNASYN) 3 g in sodium chloride 0.9 % 100 mL IVPB     3 g 100 mL/hr over 60 Minutes Intravenous  Once 02/12/13 0335 02/12/13 0451          Subjective:   [name] today has, No headache, No chest pain, No abdominal pain - No Nausea, No new weakness tingling or numbness, No Cough - SOB. Improved throat pain and odynophagia  Objective:   Filed Vitals:   02/12/13 0400 02/12/13 0500 02/12/13 0605 02/12/13 0830  BP: 140/97 127/94 149/104 138/84  Pulse: 67 63 83   Temp:   98.4 F (36.9 C)   TempSrc:   Oral   Resp: 24 21 20    Height:   5\' 10"  (1.778 m)   Weight:   108.5 kg (239 lb 3.2 oz)   SpO2: 100% 100% 100%     Wt Readings from Last 3 Encounters:  02/12/13 108.5 kg (239 lb 3.2 oz)     Intake/Output Summary (Last 24 hours) at 02/12/13 1226 Last data filed at 02/12/13 0857  Gross per 24 hour  Intake    480 ml  Output      0 ml  Net  480 ml    Exam Awake Alert, Oriented X 3, No new F.N deficits, Normal affect [place].AT,PERRAL Supple Neck,No JVD, No cervical lymphadenopathy appriciated. Some tenderness in the left neck area Symmetrical Chest wall movement, Good air movement bilaterally, CTAB RRR,No Gallops,Rubs or new Murmurs, No Parasternal Heave +ve B.Sounds, Abd Soft, Non tender, No organomegaly appriciated, No rebound - guarding or rigidity. No Cyanosis, Clubbing or edema, No new Rash or bruise     Data Review   Micro Results Recent Results (from the past 240 hour(s))  RAPID STREP SCREEN     Status: None   Collection Time    02/12/13  1:08 AM      Result Value Range Status   Streptococcus, Group A Screen (Direct) NEGATIVE  NEGATIVE Final   Comment: (NOTE)     A Rapid Antigen test may result negative if the antigen level in the     sample is below the detection level of this test. The FDA has not     cleared this test as a  stand-alone test therefore the rapid antigen     negative result has reflexed to a Group A Strep culture.    Radiology Reports Dg Cervical Spine Complete  02/11/2013   CLINICAL DATA:  Neck pain since yesterday morning. Swelling of left-sided neck. No injury.  EXAM: CERVICAL SPINE  4+ VIEWS  COMPARISON:  None.  FINDINGS: The lateral view images through the mid C7 level. Prevertebral soft tissues are within normal limits. Mild straightening and reversal of expected cervical lordosis about the C5-6 level. Maintenance of vertebral body height.  Swimmer's view is suboptimal. The C7-T1 level is grossly within normal limits.  Facets are well-aligned. Suspect mild neural foraminal narrowing at C4-5 and C5-6 on the right. C4-5 on the left. Minimal endplate osteophyte formation at C4-5 and C5-6. Open-mouth view suboptimal without displaced odontoid fracture.  IMPRESSION: Mild spondylosis, without acute osseous abnormality.  Mild nonspecific straightening and reversal of expected cervical lordosis.   Electronically Signed   By: Jeronimo Greaves M.D.   On: 02/11/2013 19:38   Ct Soft Tissue Neck W Contrast  02/12/2013   CLINICAL DATA:  Left-sided neck pain, concern for peritonsillar abscess  EXAM: CT NECK WITH CONTRAST  TECHNIQUE: Multidetector CT imaging of the neck was performed using the standard protocol following the bolus administration of intravenous contrast.  CONTRAST:  75mL OMNIPAQUE IOHEXOL 300 MG/ML  SOLN  COMPARISON:  None.  FINDINGS: The visualized portions of the brain are unremarkable.  Orbits are within normal limits.  There is partial opacification of the right frontal sinus and anterior right ethmoidal air cells as well. No mastoid effusion.  Oral cavity is within normal limits without evidence of loculated fluid collection or mass lesion. Tonsils are symmetric. No tonsillar or peritonsillar abscess identified. Parapharyngeal fat is preserved.  The nasopharynx is within normal limits.  A somewhat  ill-defined hypodense collection measuring 3.1 (transverse) x 1.0 (AP) x 6.0 (craniocaudad) cm is seen within the retropharyngeal space (series 2, image 53). This collection extends from the C1-2 articulations inferiorly to the level of C4. The node definite rim enhancement seen, however, finding is concerning for retropharyngeal abscess. No definite associated intravascular thrombosis to suggest associated thrombophlebitis identified. The airway remains midline and widely patent.  The remainder of the hypopharynx is unremarkable. The supraglottic larynx and subglottic larynx is are normal. True vocal cords are symmetric in appearance without abnormality.  The thyroid gland is within normal limits.  No pathologically enlarged lymph  nodes are identified within the neck.  Visualized lung apices are normal.  Straightening of the normal cervical lordosis is likely related to patient positioning. No acute osseous abnormality. No focal lytic or blastic osseous lesions.  IMPRESSION: 1. Ill defined hypodense collection within the retropharyngeal space extending from the C1-2 articulation inferiorly to the level of C4, most compatible with early/developing retropharyngeal abscess. The airway remains widely patent. No definite venous thrombosis identified to suggest associated thrombophlebitis. 2. Right frontoethmoidal and maxillary sinus disease.   Electronically Signed   By: Rise Mu M.D.   On: 02/12/2013 03:09    CBC  Recent Labs Lab 02/11/13 2240 02/12/13 0518  WBC 16.0* 14.0*  HGB 13.4 12.5  HCT 40.8 37.6  PLT 258 233  MCV 90.3 90.4  MCH 29.6 30.0  MCHC 32.8 33.2  RDW 13.0 13.0  LYMPHSABS 4.9* 4.7*  MONOABS 1.4* 1.2*  EOSABS 0.3 0.3  BASOSABS 0.0 0.0    Chemistries   Recent Labs Lab 02/11/13 2240 02/12/13 0738  NA 141 141  K 4.2 4.0  CL 103 104  CO2 23 24  GLUCOSE 104* 121*  BUN 10 6  CREATININE 0.53 0.52  CALCIUM 9.2 9.0  AST 11 11  ALT 10 9  ALKPHOS 65 65  BILITOT  0.2* 0.3   ------------------------------------------------------------------------------------------------------------------ estimated creatinine clearance is 134.7 ml/min (by C-G formula based on Cr of 0.52). ------------------------------------------------------------------------------------------------------------------ No results found for this basename: HGBA1C,  in the last 72 hours ------------------------------------------------------------------------------------------------------------------ No results found for this basename: CHOL, HDL, LDLCALC, TRIG, CHOLHDL, LDLDIRECT,  in the last 72 hours ------------------------------------------------------------------------------------------------------------------ No results found for this basename: TSH, T4TOTAL, FREET3, T3FREE, THYROIDAB,  in the last 72 hours ------------------------------------------------------------------------------------------------------------------ No results found for this basename: VITAMINB12, FOLATE, FERRITIN, TIBC, IRON, RETICCTPCT,  in the last 72 hours  Coagulation profile No results found for this basename: INR, PROTIME,  in the last 168 hours  No results found for this basename: DDIMER,  in the last 72 hours  Cardiac Enzymes  Recent Labs Lab 02/11/13 2240  TROPONINI <0.30   ------------------------------------------------------------------------------------------------------------------ No components found with this basename: POCBNP,      Time Spent in minutes   35   SINGH,PRASHANT K M.D on 02/12/2013 at 12:26 PM  Between 7am to 7pm - Pager - [phone]  After 7pm go to [email or web address] - password [email or web address]  And look for the night coverage person covering for me after hours  [facility] Office  [phone]

## 2013-02-12 NOTE — Consult Note (Signed)
[name], [name] 32 y.o., female [id number]     Chief Complaint: Neck Pain  HPI: 33 yo bf, 1 mo. Hx stiff neck, esp AM, resolved with time and massage/warm compress.  No sore throat, fever, voice change, swallowing diff, breathing problems.  No DM, HIV, immune compromise.  No hx IV drug  use, Portacath, cancer, chemotherapy. One broken LEFT maxillary tooth with no symptoms.    2 day hx much worse with neck tenderness and reduced mobility.  Low grade fever, undocumented.  Perception of need to take deep breaths, but no voice change or true dyspnea.  No chest pain.  Seen last PM in Urgent care with negative C-spine films except loss of cervical lordosis.  New onset Afib.  Pt has noted some arrhythmias.  No throat or neck trauma.  No foreign body ingestion.  No leg pain, hx blood clots.  No N, V, abd pain.  Some pain with swallowing.  ER eval  T99, WBC 15K with 60% Neutrophils.  CT neck shows a flat 3 x 6 cm by 1 deep indistinct fluid collection from C1 down to C4, sl to LEFT of midline.  No longus colli calcifications.  No adenopathy.  Airway good.  PMH: Past Medical History  Diagnosis Date  . Hypertension   . Diabetes mellitus without complication     Surg FX:[id number] reviewed. No pertinent past surgical history.  FHx:  No family history on file. SocHx:  reports that she has never smoked. She does not have any smokeless tobacco history on file. She reports that she does not drink alcohol or use illicit drugs.  ALLERGIES: No Known Allergies   (Not in a hospital admission)  Results for orders placed during the hospital encounter of 02/11/13 (from the past 48 hour(s))  CBC WITH DIFFERENTIAL     Status: Abnormal   Collection Time    02/11/13 10:40 PM      Result Value Range   WBC 16.0 (*) 4.0 - 10.5 K/uL   RBC 4.52  3.87 - 5.11 MIL/uL   Hemoglobin 13.4  12.0 - 15.0 g/dL   HCT 40.8  36.0 - 46.0 %   MCV 90.3  78.0 - 100.0 fL   MCH 29.6  26.0 - 34.0 pg   MCHC 32.8  30.0 - 36.0 g/dL    RDW 13.0  11.5 - 15.5 %   Platelets 258  150 - 400 K/uL   Neutrophils Relative % 59  43 - 77 %   Neutro Abs 9.4 (*) 1.7 - 7.7 K/uL   Lymphocytes Relative 31  12 - 46 %   Lymphs Abs 4.9 (*) 0.7 - 4.0 K/uL   Monocytes Relative 9  3 - 12 %   Monocytes Absolute 1.4 (*) 0.1 - 1.0 K/uL   Eosinophils Relative 2  0 - 5 %   Eosinophils Absolute 0.3  0.0 - 0.7 K/uL   Basophils Relative 0  0 - 1 %   Basophils Absolute 0.0  0.0 - 0.1 K/uL  COMPREHENSIVE METABOLIC PANEL     Status: Abnormal   Collection Time    02/11/13 10:40 PM      Result Value Range   Sodium 141  137 - 147 mEq/L   Potassium 4.2  3.7 - 5.3 mEq/L   Chloride 103  96 - 112 mEq/L   CO2 23  19 - 32 mEq/L   Glucose, Bld 104 (*) 70 - 99 mg/dL   BUN 10  6 - 23 mg/dL  Creatinine, Ser 0.53  0.50 - 1.10 mg/dL   Calcium 9.2  8.4 - 10.5 mg/dL   Total Protein 7.9  6.0 - 8.3 g/dL   Albumin 3.9  3.5 - 5.2 g/dL   AST 11  0 - 37 U/L   ALT 10  0 - 35 U/L   Alkaline Phosphatase 65  39 - 117 U/L   Total Bilirubin 0.2 (*) 0.3 - 1.2 mg/dL   GFR calc non Af Amer >90  >90 mL/min   GFR calc Af Amer >90  >90 mL/min   Comment: (NOTE)     The eGFR has been calculated using the CKD EPI equation.     This calculation has not been validated in all clinical situations.     eGFR's persistently <90 mL/min signify possible Chronic Kidney     Disease.  TROPONIN I     Status: None   Collection Time    02/11/13 10:40 PM      Result Value Range   Troponin I <0.30  <0.30 ng/mL   Comment:            Due to the release kinetics of cTnI,     a negative result within the first hours     of the onset of symptoms does not rule out     myocardial infarction with certainty.     If myocardial infarction is still suspected,     repeat the test at appropriate intervals.  RAPID STREP SCREEN     Status: None   Collection Time    02/12/13  1:08 AM      Result Value Range   Streptococcus, Group A Screen (Direct) NEGATIVE  NEGATIVE   Comment: (NOTE)     A Rapid  Antigen test may result negative if the antigen level in the     sample is below the detection level of this test. The FDA has not     cleared this test as a stand-alone test therefore the rapid antigen     negative result has reflexed to a Group A Strep culture.  URINALYSIS, ROUTINE W REFLEX MICROSCOPIC     Status: Abnormal   Collection Time    02/12/13  1:41 AM      Result Value Range   Color, Urine YELLOW  YELLOW   APPearance CLOUDY (*) CLEAR   Specific Gravity, Urine 1.031 (*) 1.005 - 1.030   pH 6.5  5.0 - 8.0   Glucose, UA NEGATIVE  NEGATIVE mg/dL   Hgb urine dipstick TRACE (*) NEGATIVE   Bilirubin Urine NEGATIVE  NEGATIVE   Ketones, ur NEGATIVE  NEGATIVE mg/dL   Protein, ur NEGATIVE  NEGATIVE mg/dL   Urobilinogen, UA 0.2  0.0 - 1.0 mg/dL   Nitrite NEGATIVE  NEGATIVE   Leukocytes, UA NEGATIVE  NEGATIVE  PREGNANCY, URINE     Status: None   Collection Time    02/12/13  1:41 AM      Result Value Range   Preg Test, Ur NEGATIVE  NEGATIVE   Comment:            THE SENSITIVITY OF THIS     METHODOLOGY IS >20 mIU/mL.  URINE MICROSCOPIC-ADD ON     Status: None   Collection Time    02/12/13  1:41 AM      Result Value Range   Squamous Epithelial / LPF RARE  RARE   WBC, UA 0-2  <3 WBC/hpf   RBC / HPF 0-2  <3 RBC/hpf  Bacteria, UA RARE  RARE   Dg Cervical Spine Complete  02/11/2013   CLINICAL DATA:  Neck pain since yesterday morning. Swelling of left-sided neck. No injury.  EXAM: CERVICAL SPINE  4+ VIEWS  COMPARISON:  None.  FINDINGS: The lateral view images through the mid C7 level. Prevertebral soft tissues are within normal limits. Mild straightening and reversal of expected cervical lordosis about the C5-6 level. Maintenance of vertebral body height.  Swimmer's view is suboptimal. The C7-T1 level is grossly within normal limits.  Facets are well-aligned. Suspect mild neural foraminal narrowing at C4-5 and C5-6 on the right. C4-5 on the left. Minimal endplate osteophyte formation at  C4-5 and C5-6. Open-mouth view suboptimal without displaced odontoid fracture.  IMPRESSION: Mild spondylosis, without acute osseous abnormality.  Mild nonspecific straightening and reversal of expected cervical lordosis.   Electronically Signed   By: Abigail Miyamoto M.D.   On: 02/11/2013 19:38   Ct Soft Tissue Neck W Contrast  02/12/2013   CLINICAL DATA:  Left-sided neck pain, concern for peritonsillar abscess  EXAM: CT NECK WITH CONTRAST  TECHNIQUE: Multidetector CT imaging of the neck was performed using the standard protocol following the bolus administration of intravenous contrast.  CONTRAST:  69m OMNIPAQUE IOHEXOL 300 MG/ML  SOLN  COMPARISON:  None.  FINDINGS: The visualized portions of the brain are unremarkable.  Orbits are within normal limits.  There is partial opacification of the right frontal sinus and anterior right ethmoidal air cells as well. No mastoid effusion.  Oral cavity is within normal limits without evidence of loculated fluid collection or mass lesion. Tonsils are symmetric. No tonsillar or peritonsillar abscess identified. Parapharyngeal fat is preserved.  The nasopharynx is within normal limits.  A somewhat ill-defined hypodense collection measuring 3.1 (transverse) x 1.0 (AP) x 6.0 (craniocaudad) cm is seen within the retropharyngeal space (series 2, image 53). This collection extends from the C1-2 articulations inferiorly to the level of C4. The node definite rim enhancement seen, however, finding is concerning for retropharyngeal abscess. No definite associated intravascular thrombosis to suggest associated thrombophlebitis identified. The airway remains midline and widely patent.  The remainder of the hypopharynx is unremarkable. The supraglottic larynx and subglottic larynx is are normal. True vocal cords are symmetric in appearance without abnormality.  The thyroid gland is within normal limits.  No pathologically enlarged lymph nodes are identified within the neck.  Visualized lung  apices are normal.  Straightening of the normal cervical lordosis is likely related to patient positioning. No acute osseous abnormality. No focal lytic or blastic osseous lesions.  IMPRESSION: 1. Ill defined hypodense collection within the retropharyngeal space extending from the C1-2 articulation inferiorly to the level of C4, most compatible with early/developing retropharyngeal abscess. The airway remains widely patent. No definite venous thrombosis identified to suggest associated thrombophlebitis. 2. Right frontoethmoidal and maxillary sinus disease.   Electronically Signed   By: BJeannine BogaM.D.   On: 02/12/2013 03:09    RWVP:XTGGYIRdysphagia.  No dyspnea.  No chest pain.  No abd pain.  No extremity pain.  No difficulty with urination or defecation.  Blood pressure 122/92, pulse 83, temperature 98 F (36.7 C), temperature source Oral, resp. rate 17, last menstrual period 02/11/2013, SpO2 99.00%.  PHYSICAL EXAM: Overall appearance:  Somewhat obese.  Slight distress.  No fetor oris.  No "hot potato" voice.  No stridor or hoarseness.  Head:  NCAT Ears:  clear Nose:  Moist, patent Oral Cavity:  Broken LEFT maxillary tooth with no swelling  or exudate.  Bulky tongue. Oral Pharynx/Hypopharynx/Larynx:  2+ tonsils.  No erythema or exudate.  No obvious retropharygeal swelling or asymmetry.   Neuro:  Grossly intact Neck;  Limited ROM.  Tender posterior cervical/occipital muscular insertions, tender over upper SCM and mastoid.  No anterior neck swelling or tenderness.  No induration.   Ext:  No calf tenderness  Studies Reviewed:  Lat C-spine, CT neck    Assessment/Plan Unusual clinical presentation.  CT scan looks like developing retropharyngeal abscess, but chronicity and symptoms related to musculoskeletal complaints atypical, Elevated white count without LEFT shift.  Association with new onset Afib uncertain.  No pathognomonic calcifications of longus colli tendonitis, but clinically  could be this.  No sign of Lemierre's syndrome.    Plan:  IV antibiotics.  IV Decadron.  Cardiology consultation.  Hospitalists to admit.  Will allow to eat today, then npo p MN pending assessment tomorrow AM.  CBC in AM tomorrow.  CT chest looking for septic emboli.  Blood cultures if febrile again.    Jodi Marble 04/28/9859, 4:43 AM

## 2013-02-12 NOTE — H&P (Addendum)
[facility] History and Physical  [name] ZOX:[id number] DOB: Apr 04, 1980 DOA: 02/11/2013  Referring physician: ER physician. PCP: No PCP Per Patient   Chief Complaint: Neck pain.  HPI: [name] is a 33 y.o. female with previous history of gestational diabetes 5 years ago presently on no medications presented to the ER because of neck pain. Patient has been experiencing pain last 2 days. Mostly the left side of her neck up to her base of the occiput. Had mild difficulty swallowing. Denies any shortness of breath cough or phlegm or fever or chills. Patient had experienced some palpitations. In the ER CT neck done shows possible retropharyngeal abscess and ENT surgeon on call Dr. Lazarus Salines was consulted. At this time patient has been started on empiric antibiotics for possible retropharyngeal abscess and also Decadron for Longus Colli Tendinits. In the ER EKG shows atrial fibrillation with with controlled rate. Cardiology was consulted. Patient denies any chest pain but did have some thickening or palpitations. Patient has never had previous neck pain issues. Denies any trauma. Has had tooth issues a month ago of the left upper molar.  Review of Systems: As presented in the history of presenting illness, rest negative.  Past Medical History  Diagnosis Date  . Hypertension   . Diabetes mellitus without complication    Past Surgical History  Procedure Laterality Date  . C sections    . Keloid removal     Social History:  reports that she has never smoked. She does not have any smokeless tobacco history on file. She reports that she does not drink alcohol or use illicit drugs. Where does patient live home. Can patient participate in ADLs? Yes.  No Known Allergies  Family History:  Family History  Problem Relation Age of Onset  . Diabetes Mellitus II Other   . Hypertension Other       Prior to Admission medications   Medication Sig Start Date End Date Taking?  Authorizing Provider  acetaminophen (TYLENOL) 500 MG tablet Take 1,000 mg by mouth every 6 (six) hours as needed for moderate pain.   Yes Historical Provider, MD  ibuprofen (ADVIL,MOTRIN) 200 MG tablet Take 400 mg by mouth every 6 (six) hours as needed for moderate pain.   Yes Historical Provider, MD    Physical Exam: Filed Vitals:   02/12/13 0315 02/12/13 0400 02/12/13 0500 02/12/13 0605  BP: 122/92 140/97 127/94 149/104  Pulse: 83 67 63 83  Temp:    98.4 F (36.9 C)  TempSrc:    Oral  Resp:  24 21 20   Height:    5\' 10"  (1.778 m)  Weight:    108.5 kg (239 lb 3.2 oz)  SpO2: 99% 100% 100% 100%     General:  Well-developed and nourished.  Eyes: Anicteric no pallor.  ENT: No discharge from the ears eyes nose or mouth.  Neck: Patient has difficulty turning head. Mild tenderness in the left neck area. No obvious mass felt.  Cardiovascular: S1-S2 heard.  Respiratory: No rhonchi crepitations.  Abdomen: Soft nontender bowel sounds present.  Skin: No rash.  Musculoskeletal: No edema.  Psychiatric: Appears normal.  Neurologic: Alert awake oriented to time place and person. Moves all extremities.  Labs on Admission:  Basic Metabolic Panel:  Recent Labs Lab 02/11/13 2240  NA 141  K 4.2  CL 103  CO2 23  GLUCOSE 104*  BUN 10  CREATININE 0.53  CALCIUM 9.2   Liver Function Tests:  Recent Labs Lab 02/11/13 2240  AST 11  ALT 10  ALKPHOS 65  BILITOT 0.2*  PROT 7.9  ALBUMIN 3.9   No results found for this basename: LIPASE, AMYLASE,  in the last 168 hours No results found for this basename: AMMONIA,  in the last 168 hours CBC:  Recent Labs Lab 02/11/13 2240 02/12/13 0518  WBC 16.0* 14.0*  NEUTROABS 9.4* 7.7  HGB 13.4 12.5  HCT 40.8 37.6  MCV 90.3 90.4  PLT 258 233   Cardiac Enzymes:  Recent Labs Lab 02/11/13 2240  TROPONINI <0.30    BNP (last 3 results) No results found for this basename: PROBNP,  in the last 8760 hours CBG: No results found  for this basename: GLUCAP,  in the last 168 hours  Radiological Exams on Admission: Dg Cervical Spine Complete  02/11/2013   CLINICAL DATA:  Neck pain since yesterday morning. Swelling of left-sided neck. No injury.  EXAM: CERVICAL SPINE  4+ VIEWS  COMPARISON:  None.  FINDINGS: The lateral view images through the mid C7 level. Prevertebral soft tissues are within normal limits. Mild straightening and reversal of expected cervical lordosis about the C5-6 level. Maintenance of vertebral body height.  Swimmer's view is suboptimal. The C7-T1 level is grossly within normal limits.  Facets are well-aligned. Suspect mild neural foraminal narrowing at C4-5 and C5-6 on the right. C4-5 on the left. Minimal endplate osteophyte formation at C4-5 and C5-6. Open-mouth view suboptimal without displaced odontoid fracture.  IMPRESSION: Mild spondylosis, without acute osseous abnormality.  Mild nonspecific straightening and reversal of expected cervical lordosis.   Electronically Signed   By: Jeronimo Greaves M.D.   On: 02/11/2013 19:38   Ct Soft Tissue Neck W Contrast  02/12/2013   CLINICAL DATA:  Left-sided neck pain, concern for peritonsillar abscess  EXAM: CT NECK WITH CONTRAST  TECHNIQUE: Multidetector CT imaging of the neck was performed using the standard protocol following the bolus administration of intravenous contrast.  CONTRAST:  75mL OMNIPAQUE IOHEXOL 300 MG/ML  SOLN  COMPARISON:  None.  FINDINGS: The visualized portions of the brain are unremarkable.  Orbits are within normal limits.  There is partial opacification of the right frontal sinus and anterior right ethmoidal air cells as well. No mastoid effusion.  Oral cavity is within normal limits without evidence of loculated fluid collection or mass lesion. Tonsils are symmetric. No tonsillar or peritonsillar abscess identified. Parapharyngeal fat is preserved.  The nasopharynx is within normal limits.  A somewhat ill-defined hypodense collection measuring 3.1  (transverse) x 1.0 (AP) x 6.0 (craniocaudad) cm is seen within the retropharyngeal space (series 2, image 53). This collection extends from the C1-2 articulations inferiorly to the level of C4. The node definite rim enhancement seen, however, finding is concerning for retropharyngeal abscess. No definite associated intravascular thrombosis to suggest associated thrombophlebitis identified. The airway remains midline and widely patent.  The remainder of the hypopharynx is unremarkable. The supraglottic larynx and subglottic larynx is are normal. True vocal cords are symmetric in appearance without abnormality.  The thyroid gland is within normal limits.  No pathologically enlarged lymph nodes are identified within the neck.  Visualized lung apices are normal.  Straightening of the normal cervical lordosis is likely related to patient positioning. No acute osseous abnormality. No focal lytic or blastic osseous lesions.  IMPRESSION: 1. Ill defined hypodense collection within the retropharyngeal space extending from the C1-2 articulation inferiorly to the level of C4, most compatible with early/developing retropharyngeal abscess. The airway remains widely patent. No definite venous thrombosis  identified to suggest associated thrombophlebitis. 2. Right frontoethmoidal and maxillary sinus disease.   Electronically Signed   By: Rise MuBenjamin  McClintock M.D.   On: 02/12/2013 03:09    EKG: Independently reviewed. Atrial fibrillation with controlled rate.  Assessment/Plan Principal Problem:   Retropharyngeal abscess Active Problems:   New onset atrial fibrillation   1. Possible retropharyngeal abscess - differential diagnosis include possible retropharyngeal abscess versus longus coli tendinitis. I have discussed with on-call ENT surgeon Dr. Lazarus SalinesWolicki. At this time patient has been continued on Unasyn. Patient is on Decadron for possible longus Coli tendinitis. Blood cultures CT chest and 2-D echo has been ordered to  check for any embolic source. At this time he decided has requested that patient can eat but n.p.o. past midnight. Continue pain with medications. 2. New onset atrial fibrillation rate controlled - appreciate cardiology consult. Thyroid function studies have been ordered. Check 2-D echo. Closely monitor in telemetry. Atrial fibrillation probably precipitated by increased vagal tone by retropharyngeal abscess. Patient will not require anticoagulants as CHADS2 score is zero. 3. Since patient is on Decadron I have placed patient on sliding-scale coverage.  I have discuss with on-call ENT surgeon Dr.Wollicki and reviewed patient's CT findings personally.    Code Status: Full code.  Family Communication: Patient's husband at the bedside.  Disposition Plan: Admit to inpatient.    Twylia Oka N. [facility] Pager [phone].  If 7PM-7AM, please contact night-coverage [email or web address] Password [email or web address] 02/12/2013, 6:21 AM

## 2013-02-12 NOTE — Progress Notes (Signed)
Hosp day 1  Vss, afeb.  Sl HTN.  WBC down to 14K (may have been ordered in error)  Review of CT neck with Dr. Benard Rinkurnes makes diagnosis of longus colli tendinitis more likely as primary diagnosis.  Will recheck pt in AM.  Await CT chest later today.  If improved, will give a course of oral steroids and discontinue antibiotics.  Thyroid labs panel pending.  Flo ShanksKarol Jowell Bossi MD

## 2013-02-12 NOTE — Plan of Care (Signed)
Problem: Phase I Progression Outcomes Goal: Pain controlled with appropriate interventions Outcome: Completed/Met Date Met:  02/12/13 Patient admitted to floor from [facility] ED for further management of retropharyngeal abscess and new onset atrial fib.  Arrived to unit via stretcher, alert and oriented x4, in no apparent distress.  Ambulatory without incident to bed.  Placed on telemetry and oriented to room and floor procedures.  Will continue to monitor patient condition.

## 2013-02-12 NOTE — ED Provider Notes (Signed)
CSN: [id number]     Arrival date & time 02/11/13  2018 History   First MD Initiated Contact with Patient 02/12/13 [phone]     Chief Complaint  Patient presents with  . transfer from ucc neck pain  af    (Consider location/radiation/quality/duration/timing/severity/associated sxs/prior Treatment) HPI 33 year old female presents to emergency department as a transfer from urgent care with complaint of left-sided neck pain and palpitations.  Patient reports she woke on Tuesday with left-sided sore throat.  She had pain with swallowing, and with palpation of her left neck.  Patient reports she often has problems with neck pain, and did not think much of this.  She reports normally she is able to massage away any pain.  Pain has persisted, however, for the last 2 days.  She denies any fever, no recent URI symptoms.  No ear pain.  She denies any active dental infections, that but does have a broken off tooth on her left upper jaw.  Patient denies any stiff neck, but does have pain with rotation to the right and to the left.  Patient reports that she has had intermittent palpitations when she lays on her left side for the last 2 days.  At urgent care, she was found to be in the with A. fib with controlled rate.  No prior history of same.  No family history.  She was sent to the ER for further evaluation for possible Mnire's syndrome or retropharyngeal abscess. Past Medical History  Diagnosis Date  . Hypertension   . Diabetes mellitus without complication    History reviewed. No pertinent past surgical history. No family history on file. History  Substance Use Topics  . Smoking status: Never Smoker   . Smokeless tobacco: Not on file  . Alcohol Use: No     Comment: occasionally   OB History   Grav Para Term Preterm Abortions TAB SAB Ect Mult Living                 Review of Systems  All other systems reviewed and are negative.    Allergies  Review of patient's allergies indicates no known  allergies.  Home Medications   Current Outpatient Rx  Name  Route  Sig  Dispense  Refill  . acetaminophen (TYLENOL) 500 MG tablet   Oral   Take 1,000 mg by mouth every 6 (six) hours as needed for moderate pain.         Marland Kitchen ibuprofen (ADVIL,MOTRIN) 200 MG tablet   Oral   Take 400 mg by mouth every 6 (six) hours as needed for moderate pain.          BP 135/81  Pulse 95  Temp(Src) 98 F (36.7 C) (Oral)  Resp 17  SpO2 100%  LMP 02/11/2013 Physical Exam  Nursing note and vitals reviewed. Constitutional: She is oriented to person, place, and time. She appears well-developed and well-nourished. She appears distressed (uncomfortable appearing, towels supporting neck).  HENT:  Head: Normocephalic and atraumatic.  Right Ear: External ear normal.  Left Ear: External ear normal.  Nose: Nose normal.  Mouth/Throat: Oropharynx is clear and moist.  Poor dentition without active infection noted  Eyes: Conjunctivae and EOM are normal. Pupils are equal, round, and reactive to light.  Neck: Normal range of motion. Neck supple. No JVD present. No tracheal deviation present. No thyromegaly present.  ttp to left lateral neck, no LAD appreciated, no masses. Slight left submental LAD  Cardiovascular: Normal heart sounds and intact distal pulses.  Exam reveals no gallop and no friction rub.   No murmur heard. Irregular irregular  Pulmonary/Chest: Effort normal and breath sounds normal. No stridor. No respiratory distress. She has no wheezes. She has no rales. She exhibits no tenderness.  Abdominal: Soft. Bowel sounds are normal. She exhibits no distension and no mass. There is no tenderness. There is no rebound and no guarding.  Musculoskeletal: Normal range of motion. She exhibits no edema and no tenderness.  Lymphadenopathy:    She has no cervical adenopathy.  Neurological: She is alert and oriented to person, place, and time. She has normal reflexes. No cranial nerve deficit. She exhibits  normal muscle tone. Coordination normal.  Skin: Skin is warm and dry. No rash noted. No erythema. No pallor.  Psychiatric: She has a normal mood and affect. Her behavior is normal. Judgment and thought content normal.    ED Course  Procedures (including critical care time) Labs Review Labs Reviewed  CBC WITH DIFFERENTIAL - Abnormal; Notable for the following:    WBC 16.0 (*)    Neutro Abs 9.4 (*)    Lymphs Abs 4.9 (*)    Monocytes Absolute 1.4 (*)    All other components within normal limits  COMPREHENSIVE METABOLIC PANEL - Abnormal; Notable for the following:    Glucose, Bld 104 (*)    Total Bilirubin 0.2 (*)    All other components within normal limits  URINALYSIS, ROUTINE W REFLEX MICROSCOPIC - Abnormal; Notable for the following:    APPearance CLOUDY (*)    Specific Gravity, Urine 1.031 (*)    Hgb urine dipstick TRACE (*)    All other components within normal limits  CBC WITH DIFFERENTIAL - Abnormal; Notable for the following:    WBC 14.0 (*)    Lymphs Abs 4.7 (*)    Monocytes Absolute 1.2 (*)    All other components within normal limits  COMPREHENSIVE METABOLIC PANEL - Abnormal; Notable for the following:    Glucose, Bld 121 (*)    All other components within normal limits  GLUCOSE, CAPILLARY - Abnormal; Notable for the following:    Glucose-Capillary 124 (*)    All other components within normal limits  GLUCOSE, CAPILLARY - Abnormal; Notable for the following:    Glucose-Capillary 106 (*)    All other components within normal limits  GLUCOSE, CAPILLARY - Abnormal; Notable for the following:    Glucose-Capillary 154 (*)    All other components within normal limits  RAPID STREP SCREEN  CULTURE, BLOOD (ROUTINE X 2)  CULTURE, BLOOD (ROUTINE X 2)  CULTURE, GROUP A STREP  TROPONIN I  PREGNANCY, URINE  URINE MICROSCOPIC-ADD ON  TSH  T4, FREE  T3, FREE  BASIC METABOLIC PANEL  CBC   Imaging Review Dg Cervical Spine Complete  02/11/2013   CLINICAL DATA:  Neck pain  since yesterday morning. Swelling of left-sided neck. No injury.  EXAM: CERVICAL SPINE  4+ VIEWS  COMPARISON:  None.  FINDINGS: The lateral view images through the mid C7 level. Prevertebral soft tissues are within normal limits. Mild straightening and reversal of expected cervical lordosis about the C5-6 level. Maintenance of vertebral body height.  Swimmer's view is suboptimal. The C7-T1 level is grossly within normal limits.  Facets are well-aligned. Suspect mild neural foraminal narrowing at C4-5 and C5-6 on the right. C4-5 on the left. Minimal endplate osteophyte formation at C4-5 and C5-6. Open-mouth view suboptimal without displaced odontoid fracture.  IMPRESSION: Mild spondylosis, without acute osseous abnormality.  Mild nonspecific straightening  and reversal of expected cervical lordosis.   Electronically Signed   By: Jeronimo GreavesKyle  Talbot M.D.   On: 02/11/2013 19:38   Ct Soft Tissue Neck W Contrast  02/12/2013   CLINICAL DATA:  Left-sided neck pain, concern for peritonsillar abscess  EXAM: CT NECK WITH CONTRAST  TECHNIQUE: Multidetector CT imaging of the neck was performed using the standard protocol following the bolus administration of intravenous contrast.  CONTRAST:  75mL OMNIPAQUE IOHEXOL 300 MG/ML  SOLN  COMPARISON:  None.  FINDINGS: The visualized portions of the brain are unremarkable.  Orbits are within normal limits.  There is partial opacification of the right frontal sinus and anterior right ethmoidal air cells as well. No mastoid effusion.  Oral cavity is within normal limits without evidence of loculated fluid collection or mass lesion. Tonsils are symmetric. No tonsillar or peritonsillar abscess identified. Parapharyngeal fat is preserved.  The nasopharynx is within normal limits.  A somewhat ill-defined hypodense collection measuring 3.1 (transverse) x 1.0 (AP) x 6.0 (craniocaudad) cm is seen within the retropharyngeal space (series 2, image 53). This collection extends from the C1-2  articulations inferiorly to the level of C4. The node definite rim enhancement seen, however, finding is concerning for retropharyngeal abscess. No definite associated intravascular thrombosis to suggest associated thrombophlebitis identified. The airway remains midline and widely patent.  The remainder of the hypopharynx is unremarkable. The supraglottic larynx and subglottic larynx is are normal. True vocal cords are symmetric in appearance without abnormality.  The thyroid gland is within normal limits.  No pathologically enlarged lymph nodes are identified within the neck.  Visualized lung apices are normal.  Straightening of the normal cervical lordosis is likely related to patient positioning. No acute osseous abnormality. No focal lytic or blastic osseous lesions.  IMPRESSION: 1. Ill defined hypodense collection within the retropharyngeal space extending from the C1-2 articulation inferiorly to the level of C4, most compatible with early/developing retropharyngeal abscess. The airway remains widely patent. No definite venous thrombosis identified to suggest associated thrombophlebitis. 2. Right frontoethmoidal and maxillary sinus disease.   Electronically Signed   By: Rise MuBenjamin  McClintock M.D.   On: 02/12/2013 03:09    EKG Interpretation    Date/Time:  Thursday February 12 2013 01:08:16 EST Ventricular Rate:  90 PR Interval:    QRS Duration: 79 QT Interval:  357 QTC Calculation: 437 R Axis:   36 Text Interpretation:  Atrial fibrillation Ventricular premature complex Borderline T wave abnormalities Baseline wander in lead(s) II III aVF V5 V6 afib new from prior of 6/14 Confirmed by Shirlyn Savin  MD, Levorn Oleski ([id number] on 02/12/2013 1:47:36 AM            MDM   1. Retropharyngeal abscess   2. New onset atrial fibrillation    33 yo female with left neck pain for 2 days, new afib.  Some chronic left neck pain/posterior occiput pain for 1 month.  CT scan with retropharyngeal mass, probable abscess, ill  defined.  To be admitted to medicine, cardiology and ENT consults completed.  Unasyn ordered.    Olivia Mackielga M Arzella Rehmann, MD 02/12/13 [phone]

## 2013-02-12 NOTE — Progress Notes (Signed)
ANTIBIOTIC CONSULT NOTE - INITIAL  Pharmacy Consult for Unasyn  Indication: Retropharyngeal Abscess  No Known Allergies  Patient Measurements: Height: 5\' 10"  (177.8 cm) Weight: 239 lb 3.2 oz (108.5 kg) IBW/kg (Calculated) : 68.5  Vital Signs: Temp: 98.4 F (36.9 C) (01/22 0605) Temp src: Oral (01/22 0605) BP: 149/104 mmHg (01/22 0605) Pulse Rate: 83 (01/22 0605)  Labs:  Recent Labs  02/11/13 2240 02/12/13 0518  WBC 16.0* 14.0*  HGB 13.4 12.5  PLT 258 233  CREATININE 0.53  --    Estimated Creatinine Clearance: 134.7 ml/min (by C-G formula based on Cr of 0.53). No results found for this basename: VANCOTROUGH, Leodis BinetVANCOPEAK, VANCORANDOM, GENTTROUGH, GENTPEAK, GENTRANDOM, TOBRATROUGH, TOBRAPEAK, TOBRARND, AMIKACINPEAK, AMIKACINTROU, AMIKACIN,  in the last 72 hours   Microbiology: Recent Results (from the past 720 hour(s))  RAPID STREP SCREEN     Status: None   Collection Time    02/12/13  1:08 AM      Result Value Range Status   Streptococcus, Group A Screen (Direct) NEGATIVE  NEGATIVE Final   Comment: (NOTE)     A Rapid Antigen test may result negative if the antigen level in the     sample is below the detection level of this test. The FDA has not     cleared this test as a stand-alone test therefore the rapid antigen     negative result has reflexed to a Group A Strep culture.    Medical History: Past Medical History  Diagnosis Date  . Hypertension   . Diabetes mellitus without complication    Assessment: 33 y/o F to start Unasyn for likely retropharyngeal abscess. WBC 14, other labs as above. Unasyn given at 0351.   Goal of Therapy:  Clinical resolution   Plan:  -Unasyn 3g IV q8h -Trend WBC, temp, renal function  -F/U cultures, imaging  Abran DukeLedford, Bryella Diviney 02/12/2013,6:30 AM

## 2013-02-12 NOTE — Progress Notes (Signed)
  Echocardiogram 2D Echocardiogram has been performed.  [name] 02/12/2013, 11:38 AM

## 2013-02-12 NOTE — Consult Note (Addendum)
Cardiology IP Consult  Reason for Consult:Atrial fibrillation Referring Physician: ED  HPI: Ms. Fishburn is a 33 y.o.female with hx relevant for gestational HTN and gestational diabetes, no cardiac hx who is currently being evaluated in the ED for a presumed retropharyngeal abscess. Patient presented to the ED yesterday evening for evaluation of severe, progressive neck pain. She also reported some palpitations. Her work-up revealed CT evidence of a fluid collection concerning for a retropharyngeal abscess. Telemetry and ECG demonstrated that she was in atrial fibrillation. She denies any hx of angina, SOB, LE edema, syncope. She has had very poor PO intake for 2 days. She only is aware of her palpitatons when she lies on her left side.    Past Medical History  Diagnosis Date  . Hypertension   . Diabetes mellitus without complication     History reviewed. No pertinent past surgical history.  No family history on file.  Social History:  reports that she has never smoked. She does not have any smokeless tobacco history on file. She reports that she does not drink alcohol or use illicit drugs.  Allergies: No Known Allergies  Current Facility-Administered Medications  Medication Dose Route Frequency Provider Last Rate Last Dose  . dexamethasone (DECADRON) injection 10 mg  10 mg Intravenous Q12H Flo Shanks, MD       Current Outpatient Prescriptions  Medication Sig Dispense Refill  . acetaminophen (TYLENOL) 500 MG tablet Take 1,000 mg by mouth every 6 (six) hours as needed for moderate pain.      Marland Kitchen ibuprofen (ADVIL,MOTRIN) 200 MG tablet Take 400 mg by mouth every 6 (six) hours as needed for moderate pain.        ROS: A full review of systems is obtained and is negative except as noted in the HPI.  Physical Exam: Blood pressure 127/94, pulse 63, temperature 98 F (36.7 C), temperature source Oral, resp. rate 21, last menstrual period 02/11/2013, SpO2 100.00%.  GENERAL: no acute  distress.  EYES: Extra ocular movements are intact. There is no lid lag. Sclera is anicteric.  NECK: Supple. + Lymphadenopathy involving left cervical chain. The thyroid is not enlarged.  LYMPH: There are no masses or lymphadenopathy present.  HEART: Irregularly irregular rate and rhythm. No m/g/r. No JVD LUNGS: Clear to auscultation There are no rales, rhonchi, or wheezes.  ABDOMEN: Soft, non-tender, and non-distended with normoactive bowel sounds. There is no hepatosplenomegaly.  EXTREMITIES: No clubbing, cyanosis, or edema.  PULSES: Carotids were +2 and equal bilaterally with no bruits. DP/PT pulses were +2 and equal bilaterally.  SKIN: Warm, dry, and intact.  NEUROLOGIC: The patient was oriented to person, place, and time. No overt neurologic deficits were detected.  PSYCH: Normal judgment and insight, mood is appropriate.    Results: Results for orders placed during the hospital encounter of 02/11/13 (from the past 24 hour(s))  CBC WITH DIFFERENTIAL     Status: Abnormal   Collection Time    02/11/13 10:40 PM      Result Value Range   WBC 16.0 (*) 4.0 - 10.5 K/uL   RBC 4.52  3.87 - 5.11 MIL/uL   Hemoglobin 13.4  12.0 - 15.0 g/dL   HCT 16.1  09.6 - 04.5 %   MCV 90.3  78.0 - 100.0 fL   MCH 29.6  26.0 - 34.0 pg   MCHC 32.8  30.0 - 36.0 g/dL   RDW 40.9  81.1 - 91.4 %   Platelets 258  150 - 400 K/uL   Neutrophils  Relative % 59  43 - 77 %   Neutro Abs 9.4 (*) 1.7 - 7.7 K/uL   Lymphocytes Relative 31  12 - 46 %   Lymphs Abs 4.9 (*) 0.7 - 4.0 K/uL   Monocytes Relative 9  3 - 12 %   Monocytes Absolute 1.4 (*) 0.1 - 1.0 K/uL   Eosinophils Relative 2  0 - 5 %   Eosinophils Absolute 0.3  0.0 - 0.7 K/uL   Basophils Relative 0  0 - 1 %   Basophils Absolute 0.0  0.0 - 0.1 K/uL  COMPREHENSIVE METABOLIC PANEL     Status: Abnormal   Collection Time    02/11/13 10:40 PM      Result Value Range   Sodium 141  137 - 147 mEq/L   Potassium 4.2  3.7 - 5.3 mEq/L   Chloride 103  96 - 112 mEq/L    CO2 23  19 - 32 mEq/L   Glucose, Bld 104 (*) 70 - 99 mg/dL   BUN 10  6 - 23 mg/dL   Creatinine, Ser 1.610.53  0.50 - 1.10 mg/dL   Calcium 9.2  8.4 - 09.610.5 mg/dL   Total Protein 7.9  6.0 - 8.3 g/dL   Albumin 3.9  3.5 - 5.2 g/dL   AST 11  0 - 37 U/L   ALT 10  0 - 35 U/L   Alkaline Phosphatase 65  39 - 117 U/L   Total Bilirubin 0.2 (*) 0.3 - 1.2 mg/dL   GFR calc non Af Amer >90  >90 mL/min   GFR calc Af Amer >90  >90 mL/min  TROPONIN I     Status: None   Collection Time    02/11/13 10:40 PM      Result Value Range   Troponin I <0.30  <0.30 ng/mL  RAPID STREP SCREEN     Status: None   Collection Time    02/12/13  1:08 AM      Result Value Range   Streptococcus, Group A Screen (Direct) NEGATIVE  NEGATIVE  URINALYSIS, ROUTINE W REFLEX MICROSCOPIC     Status: Abnormal   Collection Time    02/12/13  1:41 AM      Result Value Range   Color, Urine YELLOW  YELLOW   APPearance CLOUDY (*) CLEAR   Specific Gravity, Urine 1.031 (*) 1.005 - 1.030   pH 6.5  5.0 - 8.0   Glucose, UA NEGATIVE  NEGATIVE mg/dL   Hgb urine dipstick TRACE (*) NEGATIVE   Bilirubin Urine NEGATIVE  NEGATIVE   Ketones, ur NEGATIVE  NEGATIVE mg/dL   Protein, ur NEGATIVE  NEGATIVE mg/dL   Urobilinogen, UA 0.2  0.0 - 1.0 mg/dL   Nitrite NEGATIVE  NEGATIVE   Leukocytes, UA NEGATIVE  NEGATIVE  PREGNANCY, URINE     Status: None   Collection Time    02/12/13  1:41 AM      Result Value Range   Preg Test, Ur NEGATIVE  NEGATIVE  URINE MICROSCOPIC-ADD ON     Status: None   Collection Time    02/12/13  1:41 AM      Result Value Range   Squamous Epithelial / LPF RARE  RARE   WBC, UA 0-2  <3 WBC/hpf   RBC / HPF 0-2  <3 RBC/hpf   Bacteria, UA RARE  RARE    CT neck: reviewed EKG: Atrial fibrillation, rate 82. Borderline LVH.    Assessment/Recs: 33 y/o female with no hx of cardiac  disease now with newly diagnosed atrial fibrillation in the setting of a probable retropharyngeal abscess. Suspect that arrhythmia is vagally  mediated paroxysmal atrial fibrillation. It is likely secondary to carotid body stimulation from abscess resulting in excessive vagal tone. Other precipitators include dehydration, pain. Her CV exam is otherwise unremarkable and she is currently rate controlled. Recommend the following:   - Check echo to rule out underlying structural heart disease.  - Check TSH.   - Would hydrate with IV normal saline.  - No need for rate controlling agent or anticoagulation at this time.  - Will follow with you. If patient does not convert spontaneously with treatment of abscess, would then consider rhythm controlling options.   Shamal Stracener 02/12/2013, 5:33 AM

## 2013-02-13 LAB — CBC
HEMATOCRIT: 38.4 % (ref 36.0–46.0)
HEMOGLOBIN: 13 g/dL (ref 12.0–15.0)
MCH: 30 pg (ref 26.0–34.0)
MCHC: 33.9 g/dL (ref 30.0–36.0)
MCV: 88.7 fL (ref 78.0–100.0)
Platelets: 250 10*3/uL (ref 150–400)
RBC: 4.33 MIL/uL (ref 3.87–5.11)
RDW: 12.9 % (ref 11.5–15.5)
WBC: 15 10*3/uL — AB (ref 4.0–10.5)

## 2013-02-13 LAB — BASIC METABOLIC PANEL
BUN: 8 mg/dL (ref 6–23)
CHLORIDE: 104 meq/L (ref 96–112)
CO2: 20 mEq/L (ref 19–32)
Calcium: 9.3 mg/dL (ref 8.4–10.5)
Creatinine, Ser: 0.46 mg/dL — ABNORMAL LOW (ref 0.50–1.10)
GFR calc non Af Amer: >90 mL/min (ref >90)
Glucose, Bld: 125 mg/dL — ABNORMAL HIGH (ref 70–99)
Potassium: 4.3 mEq/L (ref 3.7–5.3)
Sodium: 140 mEq/L (ref 137–147)

## 2013-02-13 LAB — CULTURE, GROUP A STREP

## 2013-02-13 LAB — GLUCOSE, CAPILLARY
GLUCOSE-CAPILLARY: 135 mg/dL — AB (ref 70–99)
Glucose-Capillary: 118 mg/dL — ABNORMAL HIGH (ref 70–99)
Glucose-Capillary: 151 mg/dL — ABNORMAL HIGH (ref 70–99)

## 2013-02-13 MED ORDER — METHYLPREDNISOLONE 4 MG PO KIT: PACK | ORAL | Status: DC

## 2013-02-13 MED ORDER — IBUPROFEN 600 MG PO TABS: 600.0000 mg | ORAL_TABLET | Freq: Four times a day (QID) | ORAL | Status: DC | PRN

## 2013-02-13 MED ORDER — ACETAMINOPHEN 500 MG PO TABS: 500.0000 mg | ORAL_TABLET | Freq: Four times a day (QID) | ORAL | Status: DC | PRN

## 2013-02-13 NOTE — Progress Notes (Signed)
02/13/2013 9:41 AM  Courtney ParisVincent, [place] [id number]  [facility] Day [facility]    Temp:  [97.6 F (36.4 C)-98.5 F (36.9 C)] 97.9 F (36.6 C) (01/23 0559) Pulse Rate:  [64-93] 64 (01/23 40980632) Resp:  [16-20] 16 (01/23 11910632) BP: (134-147)/(69-104) 134/69 mmHg (01/23 0632) SpO2:  [98 %-100 %] 100 % (01/23 47820632),     Intake/Output Summary (Last 24 hours) at 02/13/13 0941 Last data filed at 02/13/13 0600  Gross per 24 hour  Intake   1850 ml  Output      0 ml  Net   1850 ml    Results for orders placed during the hospital encounter of 02/11/13 (from the past 24 hour(s))  GLUCOSE, CAPILLARY     Status: Abnormal   Collection Time    02/12/13 11:19 AM      Result Value Range   Glucose-Capillary 106 (*) 70 - 99 mg/dL  GLUCOSE, CAPILLARY     Status: Abnormal   Collection Time    02/12/13  4:46 PM      Result Value Range   Glucose-Capillary 154 (*) 70 - 99 mg/dL  GLUCOSE, CAPILLARY     Status: Abnormal   Collection Time    02/12/13  7:58 PM      Result Value Range   Glucose-Capillary 131 (*) 70 - 99 mg/dL  GLUCOSE, CAPILLARY     Status: Abnormal   Collection Time    02/13/13 12:19 AM      Result Value Range   Glucose-Capillary 135 (*) 70 - 99 mg/dL  GLUCOSE, CAPILLARY     Status: Abnormal   Collection Time    02/13/13  5:52 AM      Result Value Range   Glucose-Capillary 118 (*) 70 - 99 mg/dL  BASIC METABOLIC PANEL     Status: Abnormal   Collection Time    02/13/13  6:30 AM      Result Value Range   Sodium 140  137 - 147 mEq/L   Potassium 4.3  3.7 - 5.3 mEq/L   Chloride 104  96 - 112 mEq/L   CO2 20  19 - 32 mEq/L   Glucose, Bld 125 (*) 70 - 99 mg/dL   BUN 8  6 - 23 mg/dL   Creatinine, Ser 9.560.46 (*) 0.50 - 1.10 mg/dL   Calcium 9.3  8.4 - 21.310.5 mg/dL   GFR calc non Af Amer >90  >90 mL/min   GFR calc Af Amer >90  >90 mL/min  CBC     Status: Abnormal   Collection Time    02/13/13  6:30 AM      Result Value Range   WBC 15.0 (*) 4.0 - 10.5 K/uL   RBC 4.33  3.87 - 5.11 MIL/uL   Hemoglobin 13.0  12.0 - 15.0 g/dL   HCT 08.638.4  57.836.0 - 46.946.0 %   MCV 88.7  78.0 - 100.0 fL   MCH 30.0  26.0 - 34.0 pg   MCHC 33.9  30.0 - 36.0 g/dL   RDW 62.912.9  52.811.5 - 41.315.5 %   Platelets 250  150 - 400 K/uL    SUBJECTIVE:  Pain much improved. No breathing or swallowing problems.  Still some limitation in ROM neck.  OBJECTIVE:  Much more comfortable.  Neck more mobile.  Voice clear.    IMPRESSION:  Longus colli tendinitis. No retropharyngeal abscess.   WBC sl up, probably steroid effect.  Cardiac situation apparently resolved.  PLAN:  D/C abx.  D/C home if  OK per Hospitalists. Diet and activity ad lib.  Needs brief course of steroids or NSAIDS.  Needs physical therapy or chiropractor for neck.  Recheck my office as needed.  Flo Shanks

## 2013-02-13 NOTE — Discharge Instructions (Signed)
Follow with Primary MD in 7 days   Get CBC, CMP, checked 7 days    Activity: As tolerated with Full fall precautions use walker/cane & assistance as needed   Disposition Home     Diet: Soft Heart Healthy with aspiration precautions if needed.  For Heart failure patients - Check your Weight same time everyday, if you gain over 2 pounds, or you develop in leg swelling, experience more shortness of breath or chest pain, call your Primary MD immediately. Follow Cardiac Low Salt Diet and 1.8 lit/day fluid restriction.   On your next visit with her primary care physician please Get Medicines reviewed and adjusted.  Please request your Prim.MD to go over all Hospital Tests and Procedure/Radiological results at the follow up, please get all Hospital records sent to your Prim MD by signing hospital release before you go home.   If you experience worsening of your admission symptoms, develop shortness of breath, life threatening emergency, suicidal or homicidal thoughts you must seek medical attention immediately by calling 911 or calling your MD immediately  if symptoms less severe.  You Must read complete instructions/literature along with all the possible adverse reactions/side effects for all the Medicines you take and that have been prescribed to you. Take any new Medicines after you have completely understood and accpet all the possible adverse reactions/side effects.   Do not drive and provide baby sitting services if your were admitted for syncope or siezures until you have seen by Primary MD or a Neurologist and advised to do so again.  Do not drive when taking Pain medications.    Do not take more than prescribed Pain, Sleep and Anxiety Medications  Special Instructions: If you have smoked or chewed Tobacco  in the last 2 yrs please stop smoking, stop any regular Alcohol  and or any Recreational drug use.  Wear Seat belts while driving.   Please note  You were cared for by a  hospitalist during your hospital stay. If you have any questions about your discharge medications or the care you received while you were in the hospital after you are discharged, you can call the unit and asked to speak with the hospitalist on call if the hospitalist that took care of you is not available. Once you are discharged, your primary care physician will handle any further medical issues. Please note that NO REFILLS for any discharge medications will be authorized once you are discharged, as it is imperative that you return to your primary care physician (or establish a relationship with a primary care physician if you do not have one) for your aftercare needs so that they can reassess your need for medications and monitor your lab values.

## 2013-02-13 NOTE — Discharge Summary (Signed)
[name], is a 33 y.o. female  DOB 24-May-1980  MRN [id number].  Admission date:  02/11/2013  Admitting Physician  Eduard Clos, MD  Discharge Date:  02/13/2013   Primary MD  No PCP Per Patient  Recommendations for primary care physician for things to follow:   Outpatient followup with chiropractor if neck pain continues, please make sure patient follows with cardiology and ENT as recommended below   Admission Diagnosis  Retropharyngeal abscess [478.24] New onset atrial fibrillation [427.31]  Discharge Diagnosis   Longus Colli Tendinits , Afib    Principal Problem:   Retropharyngeal abscess Active Problems:   New onset atrial fibrillation      Past Medical History  Diagnosis Date  . Complication of anesthesia     "not strong enough during last c-section; I felt qthing" (02/12/2013)  . PONV (postoperative nausea and vomiting)   . Gestational diabetes   . Hypertension     "only during pregnancy" (02/12/2013)  . GERD (gastroesophageal reflux disease) 2000-2001    "hospitalized for a week"  . 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 New onset atrial fibrillation 02/11/2013    Past Surgical History  Procedure Laterality Date  . Cesarean section  2006; 2009  . Keloid excision Left 1990's    "back of my earlobe" (02/12/2013)     Discharge Condition: stable    Follow UP  Follow-up Information   Follow up with [facility] On 03/04/2013. (10:00; please bring photo ID and all medications with you to your appointment.  )    Contact information:   [address] [place] [employer] [place] [address] [phone]      Follow up with Flo Shanks, MD. Schedule an appointment as soon as possible for a visit in 1 week.   Specialty:   Otolaryngology   Contact information:   [address] [place] [address] [phone]       Follow up with Willa Rough, MD. Schedule an appointment as soon as possible for a visit in 1 week.   Specialty:  Cardiology   Contact information:   [address] [place] [address] [phone]         Consults obtained - ENT, Cards   Discharge Medications      Medication List         acetaminophen 500 MG tablet  Commonly known as:  TYLENOL  Take 1 tablet (500 mg total) by mouth every 6 (six) hours as needed for moderate pain.     ibuprofen 600 MG tablet  Commonly known as:  ADVIL,MOTRIN  Take 1 tablet (600 mg total) by mouth every 6 (six) hours as needed for moderate pain.     methylPREDNISolone 4 MG tablet  Commonly known as:  MEDROL DOSEPAK  follow package directions         Diet and Activity recommendation: See Discharge Instructions below   Discharge Instructions  Follow with Primary MD No PCP Per Patient in 7 days   Get CBC, CMP, checked 7 days by Primary MD and again as instructed by your Primary MD. Get a 2 view Chest X ray done next visit if you had Pneumonia of Lung problems at the Hospital.   Activity: As tolerated with Full fall precautions use walker/cane & assistance as needed   Disposition Home     Diet:  Soft heart healthy diet with aspiration precautions advance as tolerated to regular heart healthy  For Heart failure patients - Check your Weight same time everyday, if you gain over 2 pounds, or you develop in leg swelling, experience more shortness of breath or chest pain, call your Primary MD immediately. Follow Cardiac Low Salt Diet and 1.8 lit/day fluid restriction.   On your next visit with her primary care physician please Get Medicines reviewed and adjusted.  Please request your Prim.MD to go over all Hospital Tests and Procedure/Radiological results at the follow up, please get all Hospital  records sent to your Prim MD by signing hospital release before you go home.   If you experience worsening of your admission symptoms, develop shortness of breath, life threatening emergency, suicidal or homicidal thoughts you must seek medical attention immediately by calling 911 or calling your MD immediately  if symptoms less severe.  You Must read complete instructions/literature along with all the possible adverse reactions/side effects for all the Medicines you take and that have been prescribed to you. Take any new Medicines after you have completely understood and accpet all the possible adverse reactions/side effects.   Do not drive and provide baby sitting services if your were admitted for syncope or siezures until you have seen by Primary MD or a Neurologist and advised to do so again.  Do not drive when taking Pain medications.    Do not take more than prescribed Pain, Sleep and Anxiety Medications  Special Instructions: If you have smoked or chewed Tobacco  in the last 2 yrs please stop smoking, stop any regular Alcohol  and or any Recreational drug use.  Wear Seat belts while driving.   Please note  You were cared for by a hospitalist during your hospital stay. If you have any questions about your discharge medications or the care you received while you were in the hospital after you are discharged, you can call the unit and asked to speak with the hospitalist on call if the hospitalist that took care of you is not available. Once you are discharged, your primary care physician will handle any further medical issues. Please note that NO REFILLS for any discharge medications will be authorized once you are discharged, as it is imperative that you return to your primary care physician (or establish a relationship with a primary care physician if you do not have one) for your aftercare needs so that they can reassess your need for medications and monitor your lab  values.       Major procedures and Radiology Reports - PLEASE review detailed and final reports for all details, in brief -   Echo  - Left ventricle: The cavity size was normal. Wall thickness was increased in a pattern of mild LVH. Systolic function was normal. The estimated ejection fraction was in the range of 55% to 60%. Wall motion was normal; there were no regional wall motion abnormalities. - Right atrium: The atrium was mildly dilated.    Dg Cervical Spine Complete  02/11/2013   CLINICAL  DATA:  Neck pain since yesterday morning. Swelling of left-sided neck. No injury.  EXAM: CERVICAL SPINE  4+ VIEWS  COMPARISON:  None.  FINDINGS: The lateral view images through the mid C7 level. Prevertebral soft tissues are within normal limits. Mild straightening and reversal of expected cervical lordosis about the C5-6 level. Maintenance of vertebral body height.  Swimmer's view is suboptimal. The C7-T1 level is grossly within normal limits.  Facets are well-aligned. Suspect mild neural foraminal narrowing at C4-5 and C5-6 on the right. C4-5 on the left. Minimal endplate osteophyte formation at C4-5 and C5-6. Open-mouth view suboptimal without displaced odontoid fracture.  IMPRESSION: Mild spondylosis, without acute osseous abnormality.  Mild nonspecific straightening and reversal of expected cervical lordosis.   Electronically Signed   By: Jeronimo GreavesKyle  Talbot M.D.   On: 02/11/2013 19:38   Ct Soft Tissue Neck W Contrast  02/12/2013   CLINICAL DATA:  Left-sided neck pain, concern for peritonsillar abscess  EXAM: CT NECK WITH CONTRAST  TECHNIQUE: Multidetector CT imaging of the neck was performed using the standard protocol following the bolus administration of intravenous contrast.  CONTRAST:  75mL OMNIPAQUE IOHEXOL 300 MG/ML  SOLN  COMPARISON:  None.  FINDINGS: The visualized portions of the brain are unremarkable.  Orbits are within normal limits.  There is partial opacification of the right frontal  sinus and anterior right ethmoidal air cells as well. No mastoid effusion.  Oral cavity is within normal limits without evidence of loculated fluid collection or mass lesion. Tonsils are symmetric. No tonsillar or peritonsillar abscess identified. Parapharyngeal fat is preserved.  The nasopharynx is within normal limits.  A somewhat ill-defined hypodense collection measuring 3.1 (transverse) x 1.0 (AP) x 6.0 (craniocaudad) cm is seen within the retropharyngeal space (series 2, image 53). This collection extends from the C1-2 articulations inferiorly to the level of C4. The node definite rim enhancement seen, however, finding is concerning for retropharyngeal abscess. No definite associated intravascular thrombosis to suggest associated thrombophlebitis identified. The airway remains midline and widely patent.  The remainder of the hypopharynx is unremarkable. The supraglottic larynx and subglottic larynx is are normal. True vocal cords are symmetric in appearance without abnormality.  The thyroid gland is within normal limits.  No pathologically enlarged lymph nodes are identified within the neck.  Visualized lung apices are normal.  Straightening of the normal cervical lordosis is likely related to patient positioning. No acute osseous abnormality. No focal lytic or blastic osseous lesions.  IMPRESSION: 1. Ill defined hypodense collection within the retropharyngeal space extending from the C1-2 articulation inferiorly to the level of C4, most compatible with early/developing retropharyngeal abscess. The airway remains widely patent. No definite venous thrombosis identified to suggest associated thrombophlebitis. 2. Right frontoethmoidal and maxillary sinus disease.   Electronically Signed   By: Rise MuBenjamin  McClintock M.D.   On: 02/12/2013 03:09   Ct Angio Chest Pe W/cm &/or Wo Cm  02/12/2013   CLINICAL DATA:  Known retropharyngeal abscess. New onset atrial fibrillation.  EXAM: CT ANGIOGRAPHY CHEST WITH CONTRAST   TECHNIQUE: Multidetector CT imaging of the chest was performed using the standard protocol during bolus administration of intravenous contrast. Multiplanar CT image reconstructions including MIPs were obtained to evaluate the vascular anatomy.  CONTRAST:  100 mL Omnipaque 350  COMPARISON:  None.  FINDINGS: The exam is limited due to poor contrast opacity of the pulmonary arteries. There is no gross pulmonary embolus in the main pulmonary arteries evaluation of segmental and subsegmental pulmonary arteries are limited. The aorta  is normal. There is no mediastinal or hilar lymphadenopathy. The heart size is normal. There is no pericardial effusion. There are emphysematous changes in the right apex. Minimal atelectasis of the posterior lungs are identified bilaterally. There is no focal pneumonia or pleural effusion. There is a minimal hiatal hernia. The visualized upper abdominal structures are otherwise unremarkable.  Review of the MIP images confirms the above findings.  IMPRESSION: The exam is limited due to poor contrast opacity of the pulmonary arteries. There is no gross pulmonary embolus in the main pulmonary arteries evaluation of segmental and subsegmental pulmonary arteries are limited.  No gross acute abnormality identified.   Electronically Signed   By: Sherian Rein M.D.   On: 02/12/2013 16:07    Micro Results      Recent Results (from the past 240 hour(s))  RAPID STREP SCREEN     Status: None   Collection Time    02/12/13  1:08 AM      Result Value Range Status   Streptococcus, Group A Screen (Direct) NEGATIVE  NEGATIVE Final   Comment: (NOTE)     A Rapid Antigen test may result negative if the antigen level in the     sample is below the detection level of this test. The FDA has not     cleared this test as a stand-alone test therefore the rapid antigen     negative result has reflexed to a Group A Strep culture.  CULTURE, GROUP A STREP     Status: None   Collection Time    02/12/13   1:08 AM      Result Value Range Status   Specimen Description THROAT   Final   Special Requests NONE   Final   Culture     Final   Value: NO SUSPICIOUS COLONIES, CONTINUING TO HOLD     Performed at [facility]   Report Status PENDING   Incomplete  CULTURE, BLOOD (ROUTINE X 2)     Status: None   Collection Time    02/12/13  1:21 AM      Result Value Range Status   Specimen Description BLOOD RIGHT HAND   Final   Special Requests BOTTLES DRAWN AEROBIC AND ANAEROBIC 10CC EACH   Final   Culture  Setup Time     Final   Value: 02/12/2013 10:42     Performed at [facility]   Culture     Final   Value:        BLOOD CULTURE RECEIVED NO GROWTH TO DATE CULTURE WILL BE HELD FOR 5 DAYS BEFORE ISSUING A FINAL NEGATIVE REPORT     Performed at [facility]   Report Status PENDING   Incomplete  CULTURE, BLOOD (ROUTINE X 2)     Status: None   Collection Time    02/12/13  1:30 AM      Result Value Range Status   Specimen Description BLOOD LEFT HAND   Final   Special Requests BOTTLES DRAWN AEROBIC ONLY 8CC   Final   Culture  Setup Time     Final   Value: 02/12/2013 10:42     Performed at [facility]   Culture     Final   Value:        BLOOD CULTURE RECEIVED NO GROWTH TO DATE CULTURE WILL BE HELD FOR 5 DAYS BEFORE ISSUING A FINAL NEGATIVE REPORT     Performed at [facility]   Report Status PENDING  Incomplete     History of present illness and  Hospital Course:     Kindly see H&P for history of present illness and admission details, please review complete Labs, Consult reports and Test reports for all details in brief [name], is a 33 y.o. female, patient with no previous medical problems who was admitted to hospital with about 7-10 day history of left-sided neck/throat discomfort, her workup in the ER was initially suspicious for retropharyngeal abscess however after further discussions between radiologist an ENT physician Dr. Cloria Spring it was  decided that it was decided that she had Longus Colli Tendinits , of note on presentation patient also had new onset A. fib. This was likely precipitated by her pain and discomfort.    She was seen by ENT placed on IV Solu-Medrol along with NSAIDs after which he feels remarkably better, she is currently almost completely symptom-free and wants to go home, seen by ENT again will be placed on steroid taper along with NSAIDs for supportive care with outpatient ENT followup and if needed chiropractor for any remaining musculo skeletal neck issues.     For her atrial fibrillation she was seen by cardiology, echogram TSH were stable, she reverted by herself to sinus rhythm and remained in it on telemetry, this likely was single episode of atrial fibrillation due to pain, her CHDSvAsc 2 score is 0, currently no medications required but I will have her follow with cardiology one time outpatient post discharge.         Today   Subjective:   [name] today has no headache,no chest abdominal pain,no new weakness tingling or numbness, feels much better wants to go home today.    Objective:   Blood pressure 134/69, pulse 64, temperature 97.9 F (36.6 C), temperature source Oral, resp. rate 16, height 5\' 10"  (1.778 m), weight 108.5 kg (239 lb 3.2 oz), last menstrual period 02/11/2013, SpO2 100.00%.   Intake/Output Summary (Last 24 hours) at 02/13/13 1158 Last data filed at 02/13/13 1137  Gross per 24 hour  Intake   2070 ml  Output      0 ml  Net   2070 ml    Exam Awake Alert, Oriented *3, No new F.N deficits, Normal affect Richardson.AT,PERRAL Supple Neck,No JVD, No cervical lymphadenopathy appriciated.  Symmetrical Chest wall movement, Good air movement bilaterally, CTAB RRR,No Gallops,Rubs or new Murmurs, No Parasternal Heave +ve B.Sounds, Abd Soft, Non tender, No organomegaly appriciated, No rebound -guarding or rigidity. No Cyanosis, Clubbing or edema, No new Rash or bruise  Data  Review   Lab Results  Component Value Date   TSH 1.365 02/12/2013    CBC w Diff: Lab Results  Component Value Date   WBC 15.0* 02/13/2013   HGB 13.0 02/13/2013   HCT 38.4 02/13/2013   PLT 250 02/13/2013   LYMPHOPCT 34 02/12/2013   MONOPCT 9 02/12/2013   EOSPCT 2 02/12/2013   BASOPCT 0 02/12/2013    CMP: Lab Results  Component Value Date   NA 140 02/13/2013   K 4.3 02/13/2013   CL 104 02/13/2013   CO2 20 02/13/2013   BUN 8 02/13/2013   CREATININE 0.46* 02/13/2013   PROT 7.9 02/12/2013   ALBUMIN 3.8 02/12/2013   BILITOT 0.3 02/12/2013   ALKPHOS 65 02/12/2013   AST 11 02/12/2013   ALT 9 02/12/2013  .   Total Time in preparing paper work, data evaluation and todays exam - 35 minutes  Susa Raring K M.D on 02/13/2013 at  11:58 AM  [facility] Office  [phone]

## 2013-02-13 NOTE — Care Management Note (Signed)
    Page 1 of 1   02/13/2013     4:19:34 PM   CARE MANAGEMENT NOTE 02/13/2013  Patient:  Courtney ParisVINCENT,Ainslee RAY   Account Number:  [id number]  Date Initiated:  02/13/2013  Documentation initiated by:  Roza Creamer  Subjective/Objective Assessment:   PT ADM ON 1/21 WITH RETROPHARYNGEAL ABSCESS.  PTA, PT INDEPENDENT OF ADLS.     Action/Plan:   F/U APPT MADE AT [facility], PER MD REQUEST.  APPT IS 03/04/13 AT 10:00AM.   Anticipated DC Date:  02/13/2013   Anticipated DC Plan:  HOME/SELF CARE      DC Planning Services  CM consult  [facility]  Follow-up appt scheduled      Choice offered to / List presented to:             Status of service:  Completed, signed off Medicare Important Message given?   (If response is "NO", the following Medicare IM given date fields will be blank) Date Medicare IM given:   Date Additional Medicare IM given:    Discharge Disposition:  HOME/SELF CARE  Per UR Regulation:  Reviewed for med. necessity/level of care/duration of stay  If discussed at Long Length of Stay Meetings, dates discussed:    Comments:

## 2013-02-13 NOTE — Progress Notes (Signed)
Patient c/o  Chest tightness on left side with some s.o.b. Resp. Even and unlab. No anxiety, skin warm and dry. 5/10 on pain scale. B.p. 134/69 p-64 r-16 sats on r.a. 100 . Patient sleepy. Gave 2 tylenol p.o patient now says it feels like a dulll ache. Cont. To monitor pt. S.R. On monitor.

## 2013-02-18 LAB — CULTURE, BLOOD (ROUTINE X 2)
Culture: NO GROWTH
Culture: NO GROWTH

## 2013-03-04 ENCOUNTER — Inpatient Hospital Stay: Payer: Self-pay

## 2013-08-07 ENCOUNTER — Other Ambulatory Visit (HOSPITAL_COMMUNITY): Payer: Self-pay | Admitting: Obstetrics and Gynecology

## 2013-08-07 DIAGNOSIS — I4891 Unspecified atrial fibrillation: Secondary | ICD-10-CM

## 2013-08-10 ENCOUNTER — Other Ambulatory Visit (HOSPITAL_COMMUNITY): Payer: Self-pay | Admitting: Cardiology

## 2013-08-10 ENCOUNTER — Ambulatory Visit (HOSPITAL_COMMUNITY)
Admission: RE | Admit: 2013-08-10 | Discharge: 2013-08-10 | Disposition: A | Discharge status: Home / Self Care | Payer: No Typology Code available for payment source | Source: Ambulatory Visit | Attending: Obstetrics and Gynecology | Admitting: Obstetrics and Gynecology

## 2013-08-10 DIAGNOSIS — I4891 Unspecified atrial fibrillation: Secondary | ICD-10-CM | POA: Insufficient documentation

## 2013-08-21 LAB — OB RESULTS CONSOLE ANTIBODY SCREEN: ANTIBODY SCREEN: NEGATIVE

## 2013-08-21 LAB — OB RESULTS CONSOLE HIV ANTIBODY (ROUTINE TESTING): HIV: NONREACTIVE

## 2013-08-21 LAB — OB RESULTS CONSOLE RUBELLA ANTIBODY, IGM: Rubella: IMMUNE

## 2013-08-21 LAB — OB RESULTS CONSOLE RPR: RPR: NONREACTIVE

## 2013-08-21 LAB — OB RESULTS CONSOLE HEPATITIS B SURFACE ANTIGEN: Hepatitis B Surface Ag: NEGATIVE

## 2013-08-21 LAB — OB RESULTS CONSOLE ABO/RH: RH Type: POSITIVE

## 2013-09-21 ENCOUNTER — Telehealth: Payer: Self-pay | Admitting: Cardiology

## 2013-09-22 ENCOUNTER — Ambulatory Visit (INDEPENDENT_AMBULATORY_CARE_PROVIDER_SITE_OTHER): Payer: No Typology Code available for payment source | Admitting: Cardiology

## 2013-09-22 ENCOUNTER — Encounter: Payer: Self-pay | Admitting: Cardiology

## 2013-09-22 VITALS — BP 140/80 | HR 86 | Ht 70.0 in | Wt 237.0 lb

## 2013-09-22 DIAGNOSIS — I4891 Unspecified atrial fibrillation: Secondary | ICD-10-CM

## 2013-09-22 NOTE — Patient Instructions (Signed)
Your physician recommends that you schedule a follow-up appointment in: As needed  

## 2013-09-22 NOTE — Progress Notes (Signed)
HPI The patient presents for evaluation of chest discomfort. She is [redacted] weeks pregnant with her third child. She was in the hospital in January with atrial fibrillation. This occurred while she was having some acute neck pain. I have reviewed these records. She spontaneously converted to sinus rhythm. She did in retrospect feel her heart skipping and racing a little bit while she was in the hospital. She has not since had any of those palpitations. She has not had any presyncope or syncope. She's had no new shortness of breath, PND or edema.  However, during her pregnancy she has been getting some chest discomfort. This seems to be a happening at rest. It might happen 1-2 times a week. It is associated with some burping and improves after this.  . It is a sharp discomfort that might be 8/10 in intensity. She has mild nausea. She does not get excessively short of breath with this. She does not have excessive diaphoresis.  She cannot bring this on with activity and walks stairs frequently during the day.   No Known Allergies  Current Outpatient Prescriptions  Medication Sig Dispense Refill  . prenatal vitamin w/FE, FA (PRENATAL 1 + 1) 27-1 MG TABS tablet Take 1 tablet by mouth daily at 12 noon.       No current facility-administered medications for this visit.    Past Medical History  Diagnosis Date  . Complication of anesthesia     "not strong enough during last c-section; I felt qthing" (02/12/2013)  . PONV (postoperative nausea and vomiting)   . Gestational diabetes   . Hypertension     "only during pregnancy" (02/12/2013)  . GERD (gastroesophageal reflux disease) 2000-2001    "hospitalized for a week"  . 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 New onset atrial fibrillation 02/11/2013    Past Surgical History  Procedure Laterality Date  . Cesarean section  2006; 2009  . Keloid excision Left 1990's    "back of my earlobe" (02/12/2013)     Family History  Problem Relation Age of Onset  . Diabetes Mellitus II Other   . Hypertension Other     History   Social History  . Marital Status: Married    Spouse Name: N/A    Number of Children: N/A  . Years of Education: N/A   Occupational History  . Not on file.   Social History Main Topics  . Smoking status: Never Smoker   . Smokeless tobacco: Never Used     Comment: 02/12/2013 "quit smoking in ~ 2006"  . Alcohol Use: Yes     Comment: 02/12/2013 "drink occasionally; couple times/yr  . Drug Use: Yes    Special: Marijuana     Comment: 02/12/2013 'smoked marijuana in college"  . Sexual Activity: Yes   Other Topics Concern  . Not on file   Social History Narrative  . No narrative on file    ROS: As stated in the HPI and negative for all other systems.  PHYSICAL EXAM BP 140/80  Pulse 86  Ht  (1.778 m)  Wt 237 lb (107.502 kg)  BMI 34.01 kg/m2 GENERAL:  Well appearing HEENT:  Pupils equal round and reactive, fundi not visualized, oral mucosa unremarkable NECK:  No jugular venous distention, waveform within normal limits, carotid upstroke brisk and symmetric, no bruits, no thyromegaly LYMPHATICS:  No cervical, inguinal adenopathy LUNGS:  Clear to auscultation bilaterally BACK:  No CVA tenderness CHEST:  Unremarkable  HEART:  PMI not displaced or sustained,S1 and S2 within normal limits, no S3, no S4, no clicks, no rubs, no murmurs ABD:  Flat, positive bowel sounds normal in frequency in pitch, no bruits, no rebound, no guarding, no midline pulsatile mass, no hepatomegaly, no splenomegaly EXT:  2 plus pulses throughout, no edema, no cyanosis no clubbing SKIN:  No rashes no nodules NEURO:  Cranial nerves II through XII grossly intact, motor grossly intact throughout PSYCH:  Cognitively intact, oriented to person place and time   EKG:   Sinus rhythm, rate 86, axis within normal limits, intervals within normal limits, no acute ST-T wave changes.   09/22/2013   ASSESSMENT AND PLAN  CHEST PAIN:  The pretest probability of obstructive CAD as the etiology of her pain is very low. Therefore, further cardiovascular testing is not suggested. This does not sound like he would be related to her atrial fibrillation.  ATRIAL FIB:    This seemed to have been an isolated episode. She has a low thromboembolic risk. She's had no symptomatic recurrence of this. No change in therapy is indicated. No further evaluation is indicated.  I reviewed hospital records.

## 2013-09-24 NOTE — Telephone Encounter (Signed)
Closed encounter °

## 2014-01-06 ENCOUNTER — Other Ambulatory Visit (HOSPITAL_COMMUNITY): Payer: Self-pay | Admitting: Obstetrics and Gynecology

## 2014-03-04 ENCOUNTER — Encounter (HOSPITAL_COMMUNITY): Payer: Self-pay

## 2014-03-05 ENCOUNTER — Encounter (HOSPITAL_COMMUNITY): Payer: Self-pay

## 2014-03-05 ENCOUNTER — Inpatient Hospital Stay (HOSPITAL_COMMUNITY)
Admission: RE | Admit: 2014-03-05 | Discharge: 2014-03-05 | Disposition: A | Discharge status: Home / Self Care | Payer: No Typology Code available for payment source | Source: Ambulatory Visit

## 2014-03-05 HISTORY — DX: Cardiac arrhythmia, unspecified: I49.9

## 2014-03-05 NOTE — Pre-Procedure Instructions (Signed)
Pt was "no show " for her PAT appt. When she finally returned our phone calls (3:30 pm) she asked if she could come on in for her appt. Dawn had already made her a LSD at this point and I was with a 1430 pt and the 1530 pt was registering. I called pt and gave her LSD inst and obtained her history.

## 2014-03-05 NOTE — Patient Instructions (Signed)
   Your procedure is scheduled on: FEB (MONDAY) 15 2016  Enter through the Main Entrance of [facility] at: 8AM  Pick up the phone at the desk and dial [phone] and inform us of your arrival.  Please call this number if you have any problems the morning of surgery: [phone]  Remember: Do not eat food after midnight: FEB 14  Do not drink clear liquids after: FEB 14 Take these medicines the morning of surgery with a SIP OF WATER:  Do not wear jewelry, make-up, or FINGER nail polish No metal in your hair or on your body. Do not wear lotions, powders, perfumes.  You may wear deodorant.  Do not bring valuables to the hospital. Contacts, dentures or bridgework may not be worn into surgery.  Leave suitcase in the car. After Surgery it may be brought to your room. For patients being admitted to the hospital, checkout time is 11:00am the day of discharge.    Patients discharged on the day of surgery will not be allowed to drive home.

## 2014-03-06 ENCOUNTER — Encounter (HOSPITAL_COMMUNITY): Payer: Self-pay | Admitting: *Deleted

## 2014-03-06 ENCOUNTER — Inpatient Hospital Stay (HOSPITAL_COMMUNITY)
Admission: AD | Admit: 2014-03-06 | Discharge: 2014-03-06 | Disposition: A | Discharge status: Home / Self Care | Payer: BLUE CROSS/BLUE SHIELD | Source: Ambulatory Visit | Attending: Obstetrics and Gynecology | Admitting: Obstetrics and Gynecology

## 2014-03-06 DIAGNOSIS — Z3689 Encounter for other specified antenatal screening: Secondary | ICD-10-CM

## 2014-03-06 DIAGNOSIS — O368130 Decreased fetal movements, third trimester, not applicable or unspecified: Secondary | ICD-10-CM | POA: Insufficient documentation

## 2014-03-06 DIAGNOSIS — Z87891 Personal history of nicotine dependence: Secondary | ICD-10-CM | POA: Diagnosis not present

## 2014-03-06 DIAGNOSIS — O471 False labor at or after 37 completed weeks of gestation: Secondary | ICD-10-CM

## 2014-03-06 DIAGNOSIS — Z3A38 38 weeks gestation of pregnancy: Secondary | ICD-10-CM | POA: Diagnosis not present

## 2014-03-06 NOTE — MAU Provider Note (Signed)
History     CSN: [id number]  Arrival date and time: 03/06/14 [id number]   First Provider Initiated Contact with Patient 03/06/14 2017      Chief Complaint  Patient presents with  . Contractions  . Decreased Fetal Movement   HPI  Ms. [name] is a 34 y.o. G3P2000 at 7332w6d who presents to MAU today with complaint of decreased fetal movement since this morning. She states occasional, irregular contractions. She denies vaginal bleeding or LOF. She states her "fluid has been low" but otherwise denies complications with this pregnancy. The patient reports normal fetal movement since arrival in MAU.   OB History    Gravida Para Term Preterm AB TAB SAB Ectopic Multiple Living   3 2 2              Past Medical History  Diagnosis Date  . Complication of anesthesia     "not strong enough during last c-section; I felt qthing" (02/12/2013)  . PONV (postoperative nausea and vomiting)   . Hypertension     "only during pregnancy" (02/12/2013)  . GERD (gastroesophageal reflux disease) 2000-2001    "hospitalized for a week"  . 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 New onset atrial fibrillation 02/11/2013  . Gestational diabetes 2009  . Dysrhythmia     palpitations during a neck infection-dx'd with Atrial fib- no problems snice then.    Past Surgical History  Procedure Laterality Date  . Cesarean section  2006; 2009  . Keloid excision Left 1990's    "back of my earlobe" (02/12/2013)    Family History  Problem Relation Age of Onset  . Diabetes Mellitus II Other   . Hypertension Other   . CAD Father 2154    History  Substance Use Topics  . Smoking status: Former Smoker -- 1.50 packs/day for 6 years  . Smokeless tobacco: Never Used     Comment: 02/12/2013 "quit smoking in ~ 2006"  . Alcohol Use: No     Comment: 02/12/2013 "drink occasionally; couple times/yr    Allergies: No Known Allergies  Prescriptions prior to admission  Medication  Sig Dispense Refill Last Dose  . calcium carbonate (TUMS - DOSED IN MG ELEMENTAL CALCIUM) 500 MG chewable tablet Chew 1 tablet by mouth 5 (five) times daily as needed for indigestion or heartburn.    03/05/2014 at Unknown time  . IRON PO Take 1 tablet by mouth daily.   03/05/2014 at Unknown time  . prenatal vitamin w/FE, FA (PRENATAL 1 + 1) 27-1 MG TABS tablet Take 1 tablet by mouth daily.    03/05/2014 at Unknown time    Review of Systems  Constitutional: Negative for malaise/fatigue.  Gastrointestinal: Negative for abdominal pain.  Genitourinary:       Neg - vaginal bleeding + vaginal discharge ? LOF   Physical Exam   Blood pressure 137/81, pulse 75, temperature 98.5 F (36.9 C), resp. rate 16, height 5\' 10"  (1.778 m), weight 241 lb 12.8 oz (109.68 kg), last menstrual period 05/31/2013, SpO2 99 %.  Physical Exam  Constitutional: She is oriented to person, place, and time. She appears well-developed and well-nourished. No distress.  HENT:  Head: Normocephalic.  Cardiovascular: Normal rate.   Respiratory: Effort normal.  GI: Soft. She exhibits no distension and no mass. There is no tenderness. There is no rebound and no guarding.  Musculoskeletal: She exhibits no edema.  Neurological: She is alert and oriented to person, place, and time.  She has normal reflexes.  Skin: Skin is warm and dry. No erythema.  Psychiatric: She has a normal mood and affect.  Dilation: Closed Cervical Position: Posterior Presentation: Undeterminable Exam by:: Marily Memos, RNC  Fetal Monitoring: Baseline: 125 bpm, moderate variability, + accelerations, no decelerations Contractions: none MAU Course  Procedures None  MDM Reactive NST Discussed patient with Dr. Ellyn Hack. Agrees with plan to discharge. Follow-up as scheduled.   Assessment and Plan  A: SIUP at [redacted]w[redacted]d Reactive NST  P: Discharge home Kick counts and labor precautions discussed Patient advised to follow-up on Monday for C/S as scheduled   Patient may return to MAU as needed or if her condition were to change or worsen   [name], PA-C  03/06/2014, 8:39 PM

## 2014-03-06 NOTE — Discharge Instructions (Signed)

## 2014-03-06 NOTE — MAU Note (Addendum)
Today baby has not been moving as much. Having some pelvic pressure and contractions. Light headed today. Denies bleeding. Has leaked alittle fld but thinks was urine. Past couple wks fld has been low and had some sugar in urine past couple wks. REpeated glucose tol test and was ok. Had gest diab with second preg. For repeat c/s monday

## 2014-03-07 NOTE — MAU Provider Note (Signed)
Reactive NST, 130's, category 1

## 2014-03-08 ENCOUNTER — Inpatient Hospital Stay (HOSPITAL_COMMUNITY)
Admission: RE | Admit: 2014-03-08 | Discharge: 2014-03-11 | DRG: 766 | Disposition: A | Discharge status: Home / Self Care | Payer: BLUE CROSS/BLUE SHIELD | Source: Ambulatory Visit | Attending: Obstetrics and Gynecology | Admitting: Obstetrics and Gynecology

## 2014-03-08 ENCOUNTER — Inpatient Hospital Stay (HOSPITAL_COMMUNITY): Payer: BLUE CROSS/BLUE SHIELD | Admitting: Anesthesiology

## 2014-03-08 ENCOUNTER — Encounter (HOSPITAL_COMMUNITY)
Admission: RE | Disposition: A | Discharge status: Home / Self Care | Payer: Self-pay | Source: Ambulatory Visit | Attending: Obstetrics and Gynecology

## 2014-03-08 ENCOUNTER — Encounter (HOSPITAL_COMMUNITY): Payer: Self-pay | Admitting: Anesthesiology

## 2014-03-08 DIAGNOSIS — Z302 Encounter for sterilization: Secondary | ICD-10-CM

## 2014-03-08 DIAGNOSIS — R109 Unspecified abdominal pain: Secondary | ICD-10-CM

## 2014-03-08 DIAGNOSIS — N858 Other specified noninflammatory disorders of uterus: Secondary | ICD-10-CM | POA: Diagnosis present

## 2014-03-08 DIAGNOSIS — Z98891 History of uterine scar from previous surgery: Secondary | ICD-10-CM

## 2014-03-08 DIAGNOSIS — R071 Chest pain on breathing: Secondary | ICD-10-CM

## 2014-03-08 DIAGNOSIS — N736 Female pelvic peritoneal adhesions (postinfective): Secondary | ICD-10-CM | POA: Diagnosis present

## 2014-03-08 DIAGNOSIS — R0781 Pleurodynia: Secondary | ICD-10-CM | POA: Diagnosis not present

## 2014-03-08 DIAGNOSIS — Z87891 Personal history of nicotine dependence: Secondary | ICD-10-CM

## 2014-03-08 DIAGNOSIS — O3421 Maternal care for scar from previous cesarean delivery: Secondary | ICD-10-CM | POA: Diagnosis present

## 2014-03-08 DIAGNOSIS — Z3A39 39 weeks gestation of pregnancy: Secondary | ICD-10-CM | POA: Diagnosis present

## 2014-03-08 DIAGNOSIS — R1011 Right upper quadrant pain: Secondary | ICD-10-CM

## 2014-03-08 LAB — PREPARE FRESH FROZEN PLASMA
UNIT DIVISION: 0
UNIT DIVISION: 0
Unit division: 0
Unit division: 0

## 2014-03-08 LAB — CBC WITH DIFFERENTIAL/PLATELET
BASOS ABS: 0 10*3/uL (ref 0.0–0.1)
BASOS PCT: 0 % (ref 0–1)
EOS PCT: 4 % (ref 0–5)
Eosinophils Absolute: 0.4 10*3/uL (ref 0.0–0.7)
HCT: 32.8 % — ABNORMAL LOW (ref 36.0–46.0)
HEMOGLOBIN: 10.8 g/dL — AB (ref 12.0–15.0)
Lymphocytes Relative: 27 % (ref 12–46)
Lymphs Abs: 2.6 10*3/uL (ref 0.7–4.0)
MCH: 29.9 pg (ref 26.0–34.0)
MCHC: 32.9 g/dL (ref 30.0–36.0)
MCV: 90.9 fL (ref 78.0–100.0)
MONOS PCT: 10 % (ref 3–12)
Monocytes Absolute: 1 10*3/uL (ref 0.1–1.0)
NEUTROS ABS: 6 10*3/uL (ref 1.7–7.7)
Neutrophils Relative %: 59 % (ref 43–77)
Platelets: 189 10*3/uL (ref 150–400)
RBC: 3.61 MIL/uL — ABNORMAL LOW (ref 3.87–5.11)
RDW: 14.4 % (ref 11.5–15.5)
WBC: 10 10*3/uL (ref 4.0–10.5)

## 2014-03-08 LAB — COMPREHENSIVE METABOLIC PANEL
ALK PHOS: 85 U/L (ref 39–117)
ALT: 13 U/L (ref 0–35)
ALT: 12 U/L (ref 0–35)
AST: 17 U/L (ref 0–37)
AST: 34 U/L (ref 0–37)
Albumin: 3.1 g/dL — ABNORMAL LOW (ref 3.5–5.2)
Albumin: 2.4 g/dL — ABNORMAL LOW (ref 3.5–5.2)
Alkaline Phosphatase: 139 U/L — ABNORMAL HIGH (ref 39–117)
Anion gap: 3 — ABNORMAL LOW (ref 5–15)
Anion gap: 1 — ABNORMAL LOW (ref 5–15)
BILIRUBIN TOTAL: 1 mg/dL (ref 0.3–1.2)
BUN: 6 mg/dL (ref 6–23)
BUN: 9 mg/dL (ref 6–23)
CHLORIDE: 108 mmol/L (ref 96–112)
CHLORIDE: 106 mmol/L (ref 96–112)
CO2: 23 mmol/L (ref 19–32)
CO2: 23 mmol/L (ref 19–32)
CREATININE: 0.5 mg/dL (ref 0.50–1.10)
Calcium: 8.6 mg/dL (ref 8.4–10.5)
Calcium: 7.9 mg/dL — ABNORMAL LOW (ref 8.4–10.5)
Creatinine, Ser: 0.67 mg/dL (ref 0.50–1.10)
GFR calc Af Amer: >90 mL/min (ref >90)
GFR calc Af Amer: >90 mL/min (ref >90)
GFR calc non Af Amer: >90 mL/min (ref >90)
GFR calc non Af Amer: >90 mL/min (ref >90)
Glucose, Bld: 103 mg/dL — ABNORMAL HIGH (ref 70–99)
Glucose, Bld: 109 mg/dL — ABNORMAL HIGH (ref 70–99)
Potassium: 3.5 mmol/L (ref 3.5–5.1)
Potassium: 4.1 mmol/L (ref 3.5–5.1)
Sodium: 134 mmol/L — ABNORMAL LOW (ref 135–145)
Sodium: 130 mmol/L — ABNORMAL LOW (ref 135–145)
Total Bilirubin: 0.6 mg/dL (ref 0.3–1.2)
Total Protein: 6.9 g/dL (ref 6.0–8.3)
Total Protein: 4.9 g/dL — ABNORMAL LOW (ref 6.0–8.3)

## 2014-03-08 LAB — APTT
APTT: 26 s (ref 24–37)
aPTT: 26 seconds (ref 24–37)

## 2014-03-08 LAB — PROTIME-INR
INR: 1.12 (ref 0.00–1.49)
INR: 1.13 (ref 0.00–1.49)
PROTHROMBIN TIME: 14.5 s (ref 11.6–15.2)
PROTHROMBIN TIME: 14.6 s (ref 11.6–15.2)

## 2014-03-08 LAB — CBC
HCT: 25.3 % — ABNORMAL LOW (ref 36.0–46.0)
HCT: 35.3 % — ABNORMAL LOW (ref 36.0–46.0)
HEMATOCRIT: 34 % — AB (ref 36.0–46.0)
HEMOGLOBIN: 12 g/dL (ref 12.0–15.0)
Hemoglobin: 8.4 g/dL — ABNORMAL LOW (ref 12.0–15.0)
Hemoglobin: 11.6 g/dL — ABNORMAL LOW (ref 12.0–15.0)
MCH: 30.2 pg (ref 26.0–34.0)
MCH: 30 pg (ref 26.0–34.0)
MCH: 29.9 pg (ref 26.0–34.0)
MCHC: 33.2 g/dL (ref 30.0–36.0)
MCHC: 34 g/dL (ref 30.0–36.0)
MCHC: 34.1 g/dL (ref 30.0–36.0)
MCV: 91 fL (ref 78.0–100.0)
MCV: 88.3 fL (ref 78.0–100.0)
MCV: 87.6 fL (ref 78.0–100.0)
PLATELETS: 184 10*3/uL (ref 150–400)
PLATELETS: 153 10*3/uL (ref 150–400)
Platelets: 138 10*3/uL — ABNORMAL LOW (ref 150–400)
RBC: 2.78 MIL/uL — ABNORMAL LOW (ref 3.87–5.11)
RBC: 4 MIL/uL (ref 3.87–5.11)
RBC: 3.88 MIL/uL (ref 3.87–5.11)
RDW: 14.4 % (ref 11.5–15.5)
RDW: 14.3 % (ref 11.5–15.5)
RDW: 15.2 % (ref 11.5–15.5)
WBC: 12.1 10*3/uL — ABNORMAL HIGH (ref 4.0–10.5)
WBC: 19.2 10*3/uL — ABNORMAL HIGH (ref 4.0–10.5)
WBC: 16.9 10*3/uL — AB (ref 4.0–10.5)

## 2014-03-08 LAB — PREPARE RBC (CROSSMATCH)

## 2014-03-08 LAB — URINALYSIS, ROUTINE W REFLEX MICROSCOPIC
GLUCOSE, UA: NEGATIVE mg/dL
Hgb urine dipstick: NEGATIVE
Ketones, ur: 15 mg/dL — AB
LEUKOCYTES UA: NEGATIVE
Nitrite: POSITIVE — AB
PH: 6 (ref 5.0–8.0)
PROTEIN: 30 mg/dL — AB
SPECIFIC GRAVITY, URINE: 1.025 (ref 1.005–1.030)
Urobilinogen, UA: 1 mg/dL (ref 0.0–1.0)

## 2014-03-08 LAB — MASSIVE TRANSFUSION PROTOCOL ORDER (BLOOD BANK NOTIFICATION)

## 2014-03-08 LAB — FIBRINOGEN: Fibrinogen: 331 mg/dL (ref 204–475)

## 2014-03-08 LAB — URINE MICROSCOPIC-ADD ON

## 2014-03-08 SURGERY — Surgical Case
Anesthesia: Spinal | Site: Abdomen | Laterality: Left

## 2014-03-08 MED ORDER — LACTATED RINGERS IV SOLN
Freq: Once | INTRAVENOUS | Status: AC
  Administered 2014-03-08: 09:00:00 via INTRAVENOUS

## 2014-03-08 MED ORDER — LACTATED RINGERS IV SOLN
INTRAVENOUS | Status: DC
  Administered 2014-03-08 – 2014-03-10 (×7): via INTRAVENOUS

## 2014-03-08 MED ORDER — NEOSTIGMINE METHYLSULFATE 10 MG/10ML IV SOLN
INTRAVENOUS | Status: DC | PRN
  Administered 2014-03-08: 4 mg via INTRAVENOUS

## 2014-03-08 MED ORDER — MORPHINE SULFATE 0.5 MG/ML IJ SOLN
INTRAMUSCULAR | Status: AC
  Filled 2014-03-08: qty 10

## 2014-03-08 MED ORDER — LANOLIN HYDROUS EX OINT: 1.0000 "application " | TOPICAL_OINTMENT | CUTANEOUS | Status: DC | PRN

## 2014-03-08 MED ORDER — OXYTOCIN 40 UNITS IN LACTATED RINGERS INFUSION - SIMPLE MED: 62.5000 mL/h | INTRAVENOUS | Status: AC

## 2014-03-08 MED ORDER — CALCIUM CARBONATE ANTACID 500 MG PO CHEW: 1.0000 | CHEWABLE_TABLET | Freq: Every day | ORAL | Status: DC | PRN

## 2014-03-08 MED ORDER — DIPHENHYDRAMINE HCL 12.5 MG/5ML PO ELIX
12.5000 mg | ORAL_SOLUTION | Freq: Four times a day (QID) | ORAL | Status: DC | PRN
  Filled 2014-03-08: qty 5

## 2014-03-08 MED ORDER — NALOXONE HCL 1 MG/ML IJ SOLN
1.0000 ug/kg/h | INTRAVENOUS | Status: DC | PRN
  Filled 2014-03-08: qty 2

## 2014-03-08 MED ORDER — NALBUPHINE HCL 10 MG/ML IJ SOLN: 5.0000 mg | Freq: Once | INTRAMUSCULAR | Status: AC | PRN

## 2014-03-08 MED ORDER — DIPHENHYDRAMINE HCL 50 MG/ML IJ SOLN: 12.5000 mg | Freq: Four times a day (QID) | INTRAMUSCULAR | Status: DC | PRN

## 2014-03-08 MED ORDER — DIPHENHYDRAMINE HCL 50 MG/ML IJ SOLN: 12.5000 mg | INTRAMUSCULAR | Status: DC | PRN

## 2014-03-08 MED ORDER — MORPHINE SULFATE (PF) 0.5 MG/ML IJ SOLN
INTRAMUSCULAR | Status: DC | PRN
  Administered 2014-03-08: .2 mg via EPIDURAL
  Administered 2014-03-08: 4 mg via INTRAVENOUS

## 2014-03-08 MED ORDER — SENNOSIDES-DOCUSATE SODIUM 8.6-50 MG PO TABS
2.0000 | ORAL_TABLET | ORAL | Status: DC
  Administered 2014-03-09 – 2014-03-10 (×3): 2 via ORAL
  Filled 2014-03-08 (×3): qty 2

## 2014-03-08 MED ORDER — KETOROLAC TROMETHAMINE 30 MG/ML IJ SOLN: 30.0000 mg | Freq: Four times a day (QID) | INTRAMUSCULAR | Status: DC | PRN

## 2014-03-08 MED ORDER — ROCURONIUM BROMIDE 100 MG/10ML IV SOLN
INTRAVENOUS | Status: DC | PRN
  Administered 2014-03-08: 20 mg via INTRAVENOUS

## 2014-03-08 MED ORDER — NEOSTIGMINE METHYLSULFATE 10 MG/10ML IV SOLN
INTRAVENOUS | Status: AC
  Filled 2014-03-08: qty 1

## 2014-03-08 MED ORDER — NALBUPHINE HCL 10 MG/ML IJ SOLN: 5.0000 mg | INTRAMUSCULAR | Status: DC | PRN

## 2014-03-08 MED ORDER — ONDANSETRON HCL 4 MG/2ML IJ SOLN
INTRAMUSCULAR | Status: AC
  Filled 2014-03-08: qty 2

## 2014-03-08 MED ORDER — PHENYLEPHRINE 8 MG IN D5W 100 ML (0.08MG/ML) PREMIX OPTIME
INJECTION | INTRAVENOUS | Status: DC | PRN
  Administered 2014-03-08: 60 ug/min via INTRAVENOUS

## 2014-03-08 MED ORDER — CEFAZOLIN SODIUM-DEXTROSE 2-3 GM-% IV SOLR
2.0000 g | Freq: Three times a day (TID) | INTRAVENOUS | Status: AC
  Administered 2014-03-08 – 2014-03-09 (×2): 2 g via INTRAVENOUS
  Filled 2014-03-08 (×2): qty 50

## 2014-03-08 MED ORDER — DIBUCAINE 1 % RE OINT: 1.0000 "application " | TOPICAL_OINTMENT | RECTAL | Status: DC | PRN

## 2014-03-08 MED ORDER — MEASLES, MUMPS & RUBELLA VAC ~~LOC~~ INJ: 0.5000 mL | INJECTION | Freq: Once | SUBCUTANEOUS | Status: DC

## 2014-03-08 MED ORDER — ZOLPIDEM TARTRATE 5 MG PO TABS: 5.0000 mg | ORAL_TABLET | Freq: Every evening | ORAL | Status: DC | PRN

## 2014-03-08 MED ORDER — ONDANSETRON HCL 4 MG/2ML IJ SOLN: 4.0000 mg | Freq: Three times a day (TID) | INTRAMUSCULAR | Status: DC | PRN

## 2014-03-08 MED ORDER — ONDANSETRON HCL 4 MG/2ML IJ SOLN: 4.0000 mg | INTRAMUSCULAR | Status: DC | PRN

## 2014-03-08 MED ORDER — MENTHOL 3 MG MT LOZG: 1.0000 | LOZENGE | OROMUCOSAL | Status: DC | PRN

## 2014-03-08 MED ORDER — OXYCODONE-ACETAMINOPHEN 5-325 MG PO TABS
1.0000 | ORAL_TABLET | ORAL | Status: DC | PRN
  Administered 2014-03-08 – 2014-03-11 (×6): 1 via ORAL
  Filled 2014-03-08 (×6): qty 1

## 2014-03-08 MED ORDER — HYDROMORPHONE HCL 1 MG/ML IJ SOLN
INTRAMUSCULAR | Status: AC
  Administered 2014-03-08: 0.25 mg via INTRAVENOUS
  Filled 2014-03-08: qty 1

## 2014-03-08 MED ORDER — SIMETHICONE 80 MG PO CHEW: 80.0000 mg | CHEWABLE_TABLET | ORAL | Status: DC | PRN

## 2014-03-08 MED ORDER — KETAMINE HCL 10 MG/ML IJ SOLN
INTRAMUSCULAR | Status: AC
  Filled 2014-03-08: qty 1

## 2014-03-08 MED ORDER — OXYTOCIN 10 UNIT/ML IJ SOLN
INTRAMUSCULAR | Status: AC
  Filled 2014-03-08: qty 4

## 2014-03-08 MED ORDER — PROPOFOL 10 MG/ML IV BOLUS
INTRAVENOUS | Status: DC | PRN
  Administered 2014-03-08: 200 mg via INTRAVENOUS

## 2014-03-08 MED ORDER — LACTATED RINGERS IV SOLN
INTRAVENOUS | Status: DC | PRN
  Administered 2014-03-08 (×5): via INTRAVENOUS
  Administered 2014-03-08: 1000 mL
  Administered 2014-03-08: 10:00:00 via INTRAVENOUS

## 2014-03-08 MED ORDER — NALOXONE HCL 0.4 MG/ML IJ SOLN: 0.4000 mg | INTRAMUSCULAR | Status: DC | PRN

## 2014-03-08 MED ORDER — ONDANSETRON HCL 4 MG PO TABS: 4.0000 mg | ORAL_TABLET | ORAL | Status: DC | PRN

## 2014-03-08 MED ORDER — SIMETHICONE 80 MG PO CHEW
80.0000 mg | CHEWABLE_TABLET | ORAL | Status: DC
  Administered 2014-03-09 – 2014-03-10 (×3): 80 mg via ORAL
  Filled 2014-03-08 (×3): qty 1

## 2014-03-08 MED ORDER — PHENYLEPHRINE 8 MG IN D5W 100 ML (0.08MG/ML) PREMIX OPTIME
INJECTION | INTRAVENOUS | Status: AC
  Filled 2014-03-08: qty 100

## 2014-03-08 MED ORDER — SODIUM CHLORIDE 0.9 % IV SOLN
Freq: Once | INTRAVENOUS | Status: AC
  Administered 2014-03-08: 11:00:00 via INTRAVENOUS

## 2014-03-08 MED ORDER — MIDAZOLAM HCL 2 MG/2ML IJ SOLN
INTRAMUSCULAR | Status: AC
  Filled 2014-03-08: qty 2

## 2014-03-08 MED ORDER — WITCH HAZEL-GLYCERIN EX PADS: 1.0000 "application " | MEDICATED_PAD | CUTANEOUS | Status: DC | PRN

## 2014-03-08 MED ORDER — ONDANSETRON HCL 4 MG/2ML IJ SOLN: 4.0000 mg | Freq: Four times a day (QID) | INTRAMUSCULAR | Status: DC | PRN

## 2014-03-08 MED ORDER — SCOPOLAMINE 1 MG/3DAYS TD PT72: 1.0000 | MEDICATED_PATCH | Freq: Once | TRANSDERMAL | Status: DC

## 2014-03-08 MED ORDER — IBUPROFEN 600 MG PO TABS
600.0000 mg | ORAL_TABLET | Freq: Four times a day (QID) | ORAL | Status: DC
  Administered 2014-03-08 – 2014-03-11 (×11): 600 mg via ORAL
  Filled 2014-03-08 (×11): qty 1

## 2014-03-08 MED ORDER — OXYTOCIN 10 UNIT/ML IJ SOLN
40.0000 [IU] | INTRAVENOUS | Status: DC | PRN
  Administered 2014-03-08: 40 [IU] via INTRAVENOUS

## 2014-03-08 MED ORDER — PROMETHAZINE HCL 25 MG/ML IJ SOLN: 6.2500 mg | INTRAMUSCULAR | Status: DC | PRN

## 2014-03-08 MED ORDER — ONDANSETRON HCL 4 MG/2ML IJ SOLN
INTRAMUSCULAR | Status: DC | PRN
  Administered 2014-03-08 (×2): 4 mg via INTRAVENOUS

## 2014-03-08 MED ORDER — SODIUM CHLORIDE 0.9 % IV SOLN: 10.0000 mL/h | Freq: Once | INTRAVENOUS | Status: DC

## 2014-03-08 MED ORDER — IBUPROFEN 600 MG PO TABS: 600.0000 mg | ORAL_TABLET | Freq: Four times a day (QID) | ORAL | Status: DC | PRN

## 2014-03-08 MED ORDER — GLYCOPYRROLATE 0.2 MG/ML IJ SOLN
INTRAMUSCULAR | Status: DC | PRN
  Administered 2014-03-08: 0.6 mg via INTRAVENOUS

## 2014-03-08 MED ORDER — FENTANYL CITRATE 0.05 MG/ML IJ SOLN
INTRAMUSCULAR | Status: AC
  Filled 2014-03-08: qty 5

## 2014-03-08 MED ORDER — GLYCOPYRROLATE 0.2 MG/ML IJ SOLN
INTRAMUSCULAR | Status: AC
  Filled 2014-03-08: qty 1

## 2014-03-08 MED ORDER — DIPHENHYDRAMINE HCL 25 MG PO CAPS: 25.0000 mg | ORAL_CAPSULE | Freq: Four times a day (QID) | ORAL | Status: DC | PRN

## 2014-03-08 MED ORDER — PRENATAL PLUS 27-1 MG PO TABS
1.0000 | ORAL_TABLET | Freq: Every day | ORAL | Status: DC
  Administered 2014-03-09 – 2014-03-10 (×2): 1 via ORAL
  Filled 2014-03-08 (×2): qty 1

## 2014-03-08 MED ORDER — SODIUM CHLORIDE 0.9 % IJ SOLN: 3.0000 mL | INTRAMUSCULAR | Status: DC | PRN

## 2014-03-08 MED ORDER — OXYCODONE-ACETAMINOPHEN 5-325 MG PO TABS
2.0000 | ORAL_TABLET | ORAL | Status: DC | PRN
Start: 2014-03-08 — End: 2014-03-11

## 2014-03-08 MED ORDER — MIDAZOLAM HCL 5 MG/5ML IJ SOLN
INTRAMUSCULAR | Status: DC | PRN
  Administered 2014-03-08 (×2): 2 mg via INTRAVENOUS

## 2014-03-08 MED ORDER — SIMETHICONE 80 MG PO CHEW
80.0000 mg | CHEWABLE_TABLET | Freq: Three times a day (TID) | ORAL | Status: DC
  Administered 2014-03-09 – 2014-03-11 (×7): 80 mg via ORAL
  Filled 2014-03-08 (×7): qty 1

## 2014-03-08 MED ORDER — ROCURONIUM BROMIDE 100 MG/10ML IV SOLN
INTRAVENOUS | Status: AC
  Filled 2014-03-08: qty 1

## 2014-03-08 MED ORDER — FENTANYL CITRATE 0.05 MG/ML IJ SOLN
INTRAMUSCULAR | Status: AC
  Filled 2014-03-08: qty 2

## 2014-03-08 MED ORDER — SUCCINYLCHOLINE CHLORIDE 20 MG/ML IJ SOLN
INTRAMUSCULAR | Status: AC
  Filled 2014-03-08: qty 1

## 2014-03-08 MED ORDER — CEFAZOLIN SODIUM-DEXTROSE 2-3 GM-% IV SOLR
2.0000 g | INTRAVENOUS | Status: AC
  Administered 2014-03-08: 2 g via INTRAVENOUS

## 2014-03-08 MED ORDER — PROPOFOL 10 MG/ML IV BOLUS
INTRAVENOUS | Status: AC
  Filled 2014-03-08: qty 20

## 2014-03-08 MED ORDER — IRON 325 (65 FE) MG PO TABS: ORAL_TABLET | Freq: Every day | ORAL | Status: DC

## 2014-03-08 MED ORDER — SUCCINYLCHOLINE CHLORIDE 20 MG/ML IJ SOLN
INTRAMUSCULAR | Status: DC | PRN
  Administered 2014-03-08: 160 mg via INTRAVENOUS

## 2014-03-08 MED ORDER — DIPHENHYDRAMINE HCL 25 MG PO CAPS: 25.0000 mg | ORAL_CAPSULE | ORAL | Status: DC | PRN

## 2014-03-08 MED ORDER — SODIUM CHLORIDE 0.9 % IJ SOLN: 9.0000 mL | INTRAMUSCULAR | Status: DC | PRN

## 2014-03-08 MED ORDER — BUPIVACAINE IN DEXTROSE 0.75-8.25 % IT SOLN
INTRATHECAL | Status: DC | PRN
  Administered 2014-03-08: 1.6 mL via INTRATHECAL

## 2014-03-08 MED ORDER — FENTANYL CITRATE 0.05 MG/ML IJ SOLN
INTRAMUSCULAR | Status: DC | PRN
  Administered 2014-03-08: 100 ug via INTRAVENOUS
  Administered 2014-03-08: 12.5 ug via INTRAVENOUS
  Administered 2014-03-08: 87.5 ug via INTRAVENOUS
  Administered 2014-03-08: 150 ug via INTRAVENOUS

## 2014-03-08 MED ORDER — HYDROMORPHONE 0.3 MG/ML IV SOLN: INTRAVENOUS | Status: DC

## 2014-03-08 MED ORDER — KETAMINE HCL 10 MG/ML IJ SOLN
INTRAMUSCULAR | Status: DC | PRN
  Administered 2014-03-08 (×2): 20 mg via INTRAVENOUS
  Administered 2014-03-08: 30 mg via INTRAVENOUS
  Administered 2014-03-08 (×3): 20 mg via INTRAVENOUS
  Administered 2014-03-08: 10 mg via INTRAVENOUS
  Administered 2014-03-08: 20 mg via INTRAVENOUS
  Administered 2014-03-08: 40 mg via INTRAVENOUS

## 2014-03-08 MED ORDER — SCOPOLAMINE 1 MG/3DAYS TD PT72
MEDICATED_PATCH | TRANSDERMAL | Status: AC
  Filled 2014-03-08: qty 1

## 2014-03-08 MED ORDER — HYDROMORPHONE HCL 1 MG/ML IJ SOLN
0.2500 mg | INTRAMUSCULAR | Status: DC | PRN
  Administered 2014-03-08 (×2): 0.25 mg via INTRAVENOUS

## 2014-03-08 MED ORDER — CEFAZOLIN SODIUM-DEXTROSE 2-3 GM-% IV SOLR
INTRAVENOUS | Status: AC
  Filled 2014-03-08: qty 50

## 2014-03-08 MED ORDER — MEPERIDINE HCL 25 MG/ML IJ SOLN: 6.2500 mg | INTRAMUSCULAR | Status: DC | PRN

## 2014-03-08 MED ORDER — TETANUS-DIPHTH-ACELL PERTUSSIS 5-2.5-18.5 LF-MCG/0.5 IM SUSP
0.5000 mL | Freq: Once | INTRAMUSCULAR | Status: DC
  Filled 2014-03-08: qty 0.5

## 2014-03-08 MED ORDER — FERROUS SULFATE 325 (65 FE) MG PO TABS
325.0000 mg | ORAL_TABLET | Freq: Every day | ORAL | Status: DC
  Administered 2014-03-09 – 2014-03-11 (×3): 325 mg via ORAL
  Filled 2014-03-08 (×3): qty 1

## 2014-03-08 SURGICAL SUPPLY — 37 items
CLAMP CORD UMBIL (MISCELLANEOUS) ×3 IMPLANT
CLOTH BEACON ORANGE TIMEOUT ST (SAFETY) ×3 IMPLANT
CONTAINER PREFILL 10% NBF 15ML (MISCELLANEOUS) ×6 IMPLANT
DRAPE SHEET LG 3/4 BI-LAMINATE (DRAPES) ×3 IMPLANT
DRSG OPSITE POSTOP 4X10 (GAUZE/BANDAGES/DRESSINGS) ×3 IMPLANT
DRSG VASELINE 3X18 (GAUZE/BANDAGES/DRESSINGS) ×3 IMPLANT
DURAPREP 26ML APPLICATOR (WOUND CARE) ×3 IMPLANT
ELECT REM PT RETURN 9FT ADLT (ELECTROSURGICAL) ×3
ELECTRODE REM PT RTRN 9FT ADLT (ELECTROSURGICAL) ×1 IMPLANT
EXTRACTOR VACUUM KIWI (MISCELLANEOUS) IMPLANT
EXTRACTOR VACUUM M CUP 4 TUBE (SUCTIONS) ×2 IMPLANT
EXTRACTOR VACUUM M CUP 4' TUBE (SUCTIONS) ×1
GAUZE VASELINE 3X9 (GAUZE/BANDAGES/DRESSINGS) ×3 IMPLANT
GLOVE BIO SURGEON STRL SZ7.5 (GLOVE) ×3 IMPLANT
GOWN STRL REUS W/TWL LRG LVL3 (GOWN DISPOSABLE) ×6 IMPLANT
KIT ABG SYR 3ML LUER SLIP (SYRINGE) IMPLANT
NEEDLE HYPO 25X5/8 SAFETYGLIDE (NEEDLE) IMPLANT
NS IRRIG 1000ML POUR BTL (IV SOLUTION) ×3 IMPLANT
PACK C SECTION WH (CUSTOM PROCEDURE TRAY) ×3 IMPLANT
PAD ABD 7.5X8 STRL (GAUZE/BANDAGES/DRESSINGS) ×3 IMPLANT
PAD OB MATERNITY 4.3X12.25 (PERSONAL CARE ITEMS) ×3 IMPLANT
RTRCTR C-SECT PINK 25CM LRG (MISCELLANEOUS) ×3 IMPLANT
SPONGE GAUZE 4X4 12PLY STER LF (GAUZE/BANDAGES/DRESSINGS) ×3 IMPLANT
SPONGE LAP 18X18 X RAY DECT (DISPOSABLE) ×21 IMPLANT
STAPLER VISISTAT 35W (STAPLE) IMPLANT
SUT CHROMIC 2 0 SH (SUTURE) ×9 IMPLANT
SUT CHROMIC 3 0 SH 27 (SUTURE) ×12 IMPLANT
SUT PLAIN 0 NONE (SUTURE) ×3 IMPLANT
SUT VIC AB 0 CT1 36 (SUTURE) ×30 IMPLANT
SUT VIC AB 3-0 CTX 36 (SUTURE) ×3 IMPLANT
SUT VIC AB 3-0 SH 27 (SUTURE)
SUT VIC AB 3-0 SH 27X BRD (SUTURE) IMPLANT
SUT VIC AB 4-0 KS 27 (SUTURE) ×3 IMPLANT
SUT VICRYL 0 TIES 12 18 (SUTURE) IMPLANT
TAPE CLOTH SURG 4X10 WHT LF (GAUZE/BANDAGES/DRESSINGS) ×3 IMPLANT
TOWEL OR 17X24 6PK STRL BLUE (TOWEL DISPOSABLE) ×3 IMPLANT
TRAY FOLEY CATH 14FR (SET/KITS/TRAYS/PACK) ×3 IMPLANT

## 2014-03-08 NOTE — Anesthesia Preprocedure Evaluation (Signed)
Anesthesia Evaluation    Airway Mallampati: II       Dental no notable dental hx.    Pulmonary former smoker,    Pulmonary exam normal       Cardiovascular hypertension,     Neuro/Psych    GI/Hepatic   Endo/Other  diabetes  Renal/GU      Musculoskeletal   Abdominal Normal abdominal exam  (+)   Peds  Hematology   Anesthesia Other Findings   Reproductive/Obstetrics                             Anesthesia Physical Anesthesia Plan  ASA: II  Anesthesia Plan: Spinal   Post-op Pain Management:    Induction:   Airway Management Planned:   Additional Equipment:   Intra-op Plan:   Post-operative Plan:   Informed Consent:   Plan Discussed with: CRNA and Surgeon  Anesthesia Plan Comments:         Anesthesia Quick Evaluation

## 2014-03-08 NOTE — Progress Notes (Signed)
Patient ID: [name], female   DOB: Jun 19, 1980, 34 y.o.   MRN: [id number] Ater the pt was brought to the RR the RN felt there was a steady trickle of blood from her vagina. I examined her with a sterile glove and found no clots in the vagina and found no evidence of active bleeding, only dark bloody material.

## 2014-03-08 NOTE — Anesthesia Postprocedure Evaluation (Signed)
  Anesthesia Post Note  Patient: [name]  Procedure(s) Performed: Procedure(s) (LRB): REPEAT CESAREAN SECTION with left tubal ligation; LOW TRANSVERSE ABDOMINAL INCISION,  T UTERINE INCISION (Left)  Anesthesia type: GA and spinal  Patient location: PACU  Post pain: Pain level controlled  Post assessment: Post-op Vital signs reviewed  Last Vitals:  Filed Vitals:   03/08/14 1330  BP: 122/61  Pulse:   Temp:   Resp: 18    Post vital signs: Reviewed  Level of consciousness: sedated  Complications: No apparent anesthesia complications

## 2014-03-08 NOTE — Transfer of Care (Signed)
Immediate Anesthesia Transfer of Care Note  Patient: [name]  Procedure(s) Performed: Procedure(s): REPEAT CESAREAN SECTION with left tubal ligation; LOW TRANSVERSE ABDOMINAL INCISION,  T UTERINE INCISION (Left)  Patient Location: PACU  Anesthesia Type:General and Spinal  Level of Consciousness: awake, alert , oriented and patient cooperative  Airway & Oxygen Therapy: Patient Spontanous Breathing and Patient connected to nasal cannula oxygen  Post-op Assessment: Report given to RN and Post -op Vital signs reviewed and stable  Post vital signs: Reviewed and stable  Last Vitals:  Filed Vitals:   03/08/14 1808  BP: 98/53  Pulse: 73  Temp:   Resp: 18    Complications: No apparent anesthesia complications

## 2014-03-08 NOTE — Progress Notes (Addendum)
Patient ID: [name], female   DOB: 1980/12/23, 34 y.o.   MRN: [id number] Pt seen and examined She is awake and is drowsy but can be easily aroused and communicates normally. She is pain free and her abdomen is soft and not tender.Her HGB was 12 at 2:30 PM after starting at 10.8. There are 2 major concerns at this time. Her output is low and the color of her urine is either orange or reddish orange. I have sent a UA. I would expect her urine output to be greater than 120 cc's since 2:30 PM If she has hematuria and either the hematuria persists or her output does not increase we will need to do urinary studies. She received 6 units of packed RBC's. EBL was 3500 cc's. The UA was negative for blood.

## 2014-03-08 NOTE — Lactation Note (Addendum)
This note was copied from the chart of Boy Courtney ParisYakina Hollinsworth. Lactation Consultation Note  Patient Name: Boy Courtney ParisYakina Levinson ZOXWR'UToday's Date: 03/08/2014 Reason for consult: Initial assessment Baby 6 hours of life. Mom originally wanting to BF. Mom nursed older children, 9 months first child, 5 months second child-then pumped and bottle-fed EBM for 1 month with second child. Mom has EBL of 3000. Mom is drowsy, baby has just been returned to mother from nursery. Set mom up with a DEBP. [facility] formula [facility] [address] [facility], aliment[facility]. Enc mom to use pump as she feels up to it. Assisted mom to attempt to latch baby to right breast in football position. Baby sleepy. Mom's OB and FOB entered room, so not able to perform teaching with mom. Discussed assessment, interventions, and plan with patient's RN, Okey Regalarol.  Maternal Data Has patient been taught Hand Expression?: Yes Does the patient have breastfeeding experience prior to this delivery?: Yes  Feeding Feeding Type: Breast Fed  LATCH Score/Interventions Latch: Too sleepy or reluctant, no latch achieved, no sucking elicited. Intervention(s): Skin to skin  Audible Swallowing: None Intervention(s): Skin to skin  Type of Nipple: Everted at rest and after stimulation  Comfort (Breast/Nipple): Soft / non-tender     Hold (Positioning): Full assist, staff holds infant at breast  LATCH Score: 4  Lactation Tools Discussed/Used Initiated by:: JW Date initiated:: 03/09/14   Consult Status Consult Status: Follow-up Date: 03/09/14 Follow-up type: In-patient    Geralynn OchsWILLIARD, Mykal Kirchman 03/08/2014, 4:36 PM

## 2014-03-08 NOTE — Anesthesia Procedure Notes (Signed)
Spinal Patient location during procedure: OR Start time: 03/08/2014 9:33 AM End time: 03/08/2014 9:35 AM Staffing Anesthesiologist: Leilani AbleHATCHETT, Marily Konczal Performed by: anesthesiologist  Preanesthetic Checklist Completed: patient identified, surgical consent, pre-op evaluation, timeout performed, IV checked, risks and benefits discussed and monitors and equipment checked Spinal Block Patient position: sitting Prep: site prepped and draped and DuraPrep Patient monitoring: heart rate, cardiac monitor, continuous pulse ox and blood pressure Approach: midline Location: L3-4 Injection technique: single-shot Needle Needle type: Pencan  Needle gauge: 24 G Needle length: 9 cm Needle insertion depth: 6 cm Assessment Sensory level: T6

## 2014-03-08 NOTE — Progress Notes (Signed)
Patient ID: [name], female   DOB: July 25, 1980, 34 y.o.   MRN: [id number] I examined thisd lady 03-04-14 and she reports no change in her health since that time.

## 2014-03-08 NOTE — Op Note (Signed)
NAMMarland Kitchen:  [name], [name]              ACCOUNT NO.:  [id number]  MEDICAL RECORD NO.:  [id number]  LOCATION:  [facility]                          FACILITY:  [facility]  PHYSICIAN:  Malachi Prohomas F. Ambrose MantleHenley, M.D. DATE OF BIRTH:  Sep 30, 1980  DATE OF PROCEDURE:  03/08/2014 DATE OF DISCHARGE:                              OPERATIVE REPORT   POSTOPERATIVE DIAGNOSES:  Intrauterine pregnancy, 39 weeks, 2 prior C- sections, desire for sterilization.  POSTOPERATIVE DIAGNOSIS:  Intrauterine pregnancy, 39 weeks, 2 prior C- sections, desire for sterilization with hemorrhage from the uterus, uterine wall, left tubal ligation could not find the right tube.  OPERATION:  Low-transverse cervical C-section with extension of the uterus in a T-incision toward the fundus, left tubal ligation, vacuum- assisted delivery of an 8 pounds 1 ounce female infant vertex.  Apgars of 9 and 9.  DESCRIPTION OF PROCEDURE:  The patient was brought to the operating room and given a satisfactory spinal anesthetic by Dr. Arby BarretteHatchett.  She was placed on the operating room table after sitting up for the spinal.  The vulva and urethra were prepped with Betadine solution.  The Foley catheter was inserted to straight drain by the RN.  The abdomen was then prepped with DuraPrep.  After a 3 minute delay in which the DuraPrep was allowed to dry and a time-out was done, the area was then draped as a sterile field after the 3-minute weight.  She had 2 old incisions pretty much on top of each other.  I utilized most of the old incision after I confirmed anesthesia by pinching the lower abdomen with an Allis clamp. Used most of the incision to carried in layers through the skin, subcutaneous tissue, and fascia.  The fascia was then separated from the rectus muscles superiorly and inferiorly.  This was somewhat difficult because of scar tissue.  There was no midline readily available.  We tried to create a midline and enter the peritoneal cavity, but  really could not even find the peritoneal cavity.  I finally was able to get into the peritoneal cavity.  I tried to enlarge the incision superiorly and inferiorly and with the bladder blade exposed, what I could of the lower uterine segment.  An incision was made transversely through the myometrium, I then inserted my finger into the endometrial cavity seemed like through the placenta, enlarged the incision, but still could not get a good visualization, so I enlarged the incision by making a T, I then was able to grasp the vertex of the baby, pull it to the incisional opening.  I attached a vacuum and delivered the 8 pounds 1 ounce female infant with Apgars of 9 and 9 assigned by Dr. Venetia Constableemaguila.  The placenta was then removed.  The uterine incision was exposed and I grasped the edges of the uterine incision and closed the uterine incision with 2 running sutures of 0 Vicryl locking the first layer, nonlocking on the second layer, but there were additional bleeders.  The bleeding was profuse.  I tried to attack these with a 0-Vicryl figure-of-eight sutures.  After I thought I had the bleeding controlled, I identified the left tube, traced it to its fimbriated end, but it  did not recur rise up easily, so I cut out the midportion of the tube and sutured both ends.  There was some bleeding from the mesosalpinx that was sutured with 3-0 chromic.  I searched for the right tube, but I never could find the right tube.  The left tube and left ovary and the right ovary appeared normal, but I could not find the right tube.  Dr. Ellyn Hack agreed.  I tried my best to get the uterus out of the abdominal cavity in order to inspect it for bleeding sites, but in spite of my best efforts, I could not get it out, so I used towel clips to pull up on the uterus to remove it from the abdominal cavity.  I finally was able to get it out of the abdominal cavity and sutured the sites that appeared to be bleeding with  0-Vicryl figure-of-eight sutures.  This seemed to control all bleeding sites.  At this point, there  still seemed to be bleeding on the right lateral aspect of the uterus and in spite of my best efforts at controlling the bleeding, she continued to bleed some, so I called Dr. Jackelyn Knife to see if he would come to the operating room and give [place] a 3rd set of hands.  When he got to the operating room, he was able to suture a couple of sites on the right lateral aspect of the uterus and this provided more hemostasis.  We then could see blood coming from the upper part of the uterus, but again exposure of the site was almost impossible.  Prior to this getting the uterus back into the abdominal cavity had been a major challenge, I had to use 2 Deaver retractors and all my pressure to try to put the uterus back, and we felt there was no choice at this time, but to remove the uterus from the abdominal cavity and search for bleeders.  There was a very vigorous bleeder on the right side of the uterus above the lower area, where Dr. Jackelyn Knife sutured earlier, and then I sutured all the puncture sites from the towel clips with 0-Vicryl and 3-0 chromic sutures, figure-of- eight type.  This seemed to provide hemostasis.  The anterior aspect of the uterus felt like very puffy and it may have had hematoma although it did not seem to be discolored like one would expect the hematoma to be. Getting the uterus out this time, I was able to do it by reaching deep in the pelvis posteriorly and gradually working the uterus out with the help of 2 Deaver retractors.  At this point, we felt that the bleeding seemed to be controlled.  We inspected the lower part of the uterus again, found a little more bleeding, sutured that and also used the Bovie.  There was a space that appeared to be where the scar tissue had formed before and it did not enter the bladder, it seemed to be where the lower uterine segment peritoneum  should have been.  We elected not to take this tissue down for fear of starting more bleeding.  Both sides of the uterus were adherent to the abdominal wall and these adhesions were left also.  At this point, it was felt that it was appropriate to close the abdominal wall.  The estimated blood loss at this point was 3200 mL.  The patient had received 4 units of blood, was receiving her fifth, and the plan was to give her a sixth unit  of blood.  The abdominal wall was closed with interrupted sutures of 0-Vicryl through the rectus muscle.  There was no peritoneum to reapproximate.  The fascia was closed with 2 running sutures of 0-Vicryl.  The subcutaneous with a 3-0 Vicryl and the skin was closed with automatic staples.  As the patient was being prepared to leave the operating room, what was estimated by the nurses as another 300 mL of blood was found underneath her, so a total  estimated blood loss of 3500 mL of which she has received 5 units and plan is to give a sixth unit.  Hemoglobin was done during the procedure was 8.4, hematocrit 25.3.  The patient had coag labs done, they were completely normal.  I asked Dr. Arby Barrette if the patient needed fresh frozen plasma, he said she did not need any, having completely normal labs.  At this point, the patient was being transported to the recovery room and she will get an ICU bed.In summary the procedure was complicated mostly by the uterus being part of the abdominal wall and the great difficulty removing and replacing the uterus out of and into the abdominal cavity.These 2 factors contributed to the difficulty in exposing bleeding sites.      Malachi Pro. Ambrose Mantle, M.D.     TFH/MEDQ  D:  03/08/2014  T:  03/08/2014  Job:  [id number]

## 2014-03-09 ENCOUNTER — Encounter (HOSPITAL_COMMUNITY): Payer: Self-pay | Admitting: Obstetrics and Gynecology

## 2014-03-09 LAB — RPR
RPR Ser Ql: NONREACTIVE
RPR: NONREACTIVE

## 2014-03-09 LAB — PREPARE PLATELET PHERESIS
UNIT DIVISION: 0
Unit division: 0

## 2014-03-09 LAB — CBC
HEMATOCRIT: 28 % — AB (ref 36.0–46.0)
HEMOGLOBIN: 9.9 g/dL — AB (ref 12.0–15.0)
MCH: 30.3 pg (ref 26.0–34.0)
MCHC: 35.4 g/dL (ref 30.0–36.0)
MCV: 85.6 fL (ref 78.0–100.0)
Platelets: 126 10*3/uL — ABNORMAL LOW (ref 150–400)
RBC: 3.27 MIL/uL — AB (ref 3.87–5.11)
RDW: 15.5 % (ref 11.5–15.5)
WBC: 15.9 10*3/uL — ABNORMAL HIGH (ref 4.0–10.5)

## 2014-03-09 NOTE — Progress Notes (Signed)
Patient ID: [name], female   DOB: 1980/02/26, 34 y.o.   MRN: [id number] #1 afebrile BP and pulse normal Output increasing 860 cc's over the last 12 hours. HGB 9.9 this AM. She is resting comfortably without pain Her abdomen is soft but slightly tender. She has no appetite and has not passed flatus.

## 2014-03-09 NOTE — Lactation Note (Signed)
This note was copied from the chart of Boy Courtney ParisYakina Bies. Lactation Consultation Note  Patient Name: Boy Courtney ParisYakina Polimeni WGNFA'OToday's Date: 03/09/2014 Reason for consult: Follow-up assessment Baby 25 hours of life. MOB reports that baby is nursing often, roughly every 2-3 hours. Mom states that she is able to hand express colostrum, and baby is happiest at breast. Enc mom to nurse with cues and call for assist as needed. Mom about to eat her breakfast and not wanting to latch at this time. Baby just starting to wake, but not cuing to nurse. Mom aware of OP/BFSG, community resources, and [facility] assistance after [facility].   Maternal Data Formula Feeding for Exclusion: No Has patient been taught Hand Expression?: Yes Does the patient have breastfeeding experience prior to this delivery?: Yes  Feeding Feeding Type: Breast Fed Length of feed: 10 min  LATCH Score/Interventions                      Lactation Tools Discussed/Used     Consult Status Consult Status: Follow-up Date: 03/10/14 Follow-up type: In-patient    Geralynn OchsWILLIARD, Mendell Bontempo 03/09/2014, 11:58 AM

## 2014-03-09 NOTE — H&P (Signed)
NAMNadara [name]:  [name], [name]              ACCOUNT NO.:  [id number]  MEDICAL RECORD NO.:  [id number]  LOCATION:                                 FACILITY:  PHYSICIAN:  Malachi Prohomas F. Ambrose MantleHenley, M.D. DATE OF BIRTH:  1980-09-24  DATE OF ADMISSION:  03/08/2014 DATE OF DISCHARGE:                             HISTORY & PHYSICAL   HISTORY OF PRESENT ILLNESS:  This is a 34 year old black female, para 2- 0-0-2, gravida 3, EDC March 14, 2014, admitted for repeat C-section. She is also considering tubal sterilization.  Blood group and type O positive, negative antibody.  RPR negative.  Urine culture negative. Hepatitis B surface antigen negative.  HIV negative.  GC and Chlamydia negative.  Varicella immune.  Rubella immune.  Hemoglobin AA.  Pap smear normal.  First trimester screen negative.  AFP negative.  One-hour Glucola 87 and repeated on February 26, 2014, at 118.  Repeat HIV and RPR negative.  Group B strep negative.  This patient began her prenatal course on August 21, 2013.  She gave a history of irregular periods and had previous C-sections x2.  An ultrasound on August 14, 2013, showed an estimated gestational age of [redacted] weeks 5 days [facility].  There was a single intramural fibroid in the mid posterior wall, 3 x 1.7 cm.  On October 20, 2013, an ultrasound was done that showed a gestational age of [redacted] weeks and 3 days [facility]. No abnormalities were noted.  She declines Tdap and flu vaccines.  An ultrasound done for size greater than dates at 38 weeks showed average gestational size fetus.  Amniotic fluid was low normal, so nonstress test was done and a biophysical profile, both of which were normal.  PAST MEDICAL HISTORY:  Reveals that in January, 2015, she did have atrial fibrillation.  She had a history of gestational diabetes with her second pregnancy.  She has had a keloid removed from her earlobe at age 34.  Had 2 C-sections, one for fetal stress and  one a repeat, and she had a cone biopsy of her cervix in 2004.  ALLERGIES:  She has no allergies.  No latex allergy.  No food allergies. There is a lactose intolerant, but has no drug allergies.  FAMILY HISTORY:  Paternal grandmother with cancer of the breast, diabetes, and high blood pressure.  Paternal grandfather with diabetes and high blood pressure.  Father with MI, died at age of 34.  Mother, no current problems.  SOCIAL HISTORY:  The patient was a former smoker, quit in 2012.  Does not drink.  Two years of college.  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  PHYSICAL EXAMINATION:  VITAL SIGNS:  On admission, last visit on March 03, 2014; blood pressure is 120/74, pulse was 70, weight was 240 pounds. HEART:  Normal size and sounds.  No murmurs. LUNGS:  Clear to auscultation. ABDOMEN:  Soft.  Fundal height 39 cm.  The cervix was not dilated, vertex presentation.  ADMITTING IMPRESSION:  Intrauterine pregnancy at 39 weeks.  The patient is admitted for a repeat C-section and possibly tubal ligation.  She is going to come to a final answer before the surgery as far as having her tubes tied.  She understands the risks of surgery and agrees to proceed.     Malachi Pro. Ambrose Mantle, M.D.     TFH/MEDQ  D:  03/07/2014  T:  03/07/2014  Job:  [id number]

## 2014-03-09 NOTE — Anesthesia Postprocedure Evaluation (Signed)
  Anesthesia Post-op Note  Patient: [name]  Procedure(s) Performed: Procedure(s): REPEAT CESAREAN SECTION with left tubal ligation; LOW TRANSVERSE ABDOMINAL INCISION,  T UTERINE INCISION (Left)  Patient Location: A-ICU  Anesthesia Type:General and Spinal  Level of Consciousness: awake, alert , oriented and patient cooperative  Airway and Oxygen Therapy: Patient Spontanous Breathing  Post-op Pain: none  Post-op Assessment: Post-op Vital signs reviewed, Patient's Cardiovascular Status Stable, Respiratory Function Stable, Patent Airway, No signs of Nausea or vomiting, No headache, No backache, No residual numbness and No residual motor weakness  Post-op Vital Signs: Reviewed and stable  Last Vitals:  Filed Vitals:   03/09/14 0732  BP:   Pulse:   Temp: 37.4 C  Resp: 18    Complications: No apparent anesthesia complications

## 2014-03-10 ENCOUNTER — Inpatient Hospital Stay (HOSPITAL_COMMUNITY): Payer: BLUE CROSS/BLUE SHIELD

## 2014-03-10 ENCOUNTER — Encounter (HOSPITAL_COMMUNITY): Payer: Self-pay | Admitting: Radiology

## 2014-03-10 LAB — CBC WITH DIFFERENTIAL/PLATELET
BASOS ABS: 0 10*3/uL (ref 0.0–0.1)
BASOS PCT: 0 % (ref 0–1)
EOS PCT: 3 % (ref 0–5)
Eosinophils Absolute: 0.6 10*3/uL (ref 0.0–0.7)
HEMATOCRIT: 25.8 % — AB (ref 36.0–46.0)
Hemoglobin: 8.9 g/dL — ABNORMAL LOW (ref 12.0–15.0)
Lymphocytes Relative: 9 % — ABNORMAL LOW (ref 12–46)
Lymphs Abs: 1.7 10*3/uL (ref 0.7–4.0)
MCH: 29.9 pg (ref 26.0–34.0)
MCHC: 34.5 g/dL (ref 30.0–36.0)
MCV: 86.6 fL (ref 78.0–100.0)
Monocytes Absolute: 1.6 10*3/uL — ABNORMAL HIGH (ref 0.1–1.0)
Monocytes Relative: 9 % (ref 3–12)
NEUTROS ABS: 14.4 10*3/uL — AB (ref 1.7–7.7)
Neutrophils Relative %: 79 % — ABNORMAL HIGH (ref 43–77)
Platelets: 121 10*3/uL — ABNORMAL LOW (ref 150–400)
RBC: 2.98 MIL/uL — ABNORMAL LOW (ref 3.87–5.11)
RDW: 15.3 % (ref 11.5–15.5)
WBC: 18.4 10*3/uL — ABNORMAL HIGH (ref 4.0–10.5)

## 2014-03-10 MED ORDER — IOHEXOL 300 MG/ML  SOLN: 50.0000 mL | Freq: Once | INTRAMUSCULAR | Status: AC | PRN

## 2014-03-10 MED ORDER — IOHEXOL 350 MG/ML SOLN
100.0000 mL | Freq: Once | INTRAVENOUS | Status: AC | PRN
  Administered 2014-03-10: 100 mL via INTRAVENOUS

## 2014-03-10 NOTE — Progress Notes (Signed)
Patient ID: [name], female   DOB: 1980/03/14, 34 y.o.   MRN: [id number] #2 aFEBRILE bp NORMAL Labs look OK but during the night states she began having pain with breathing in her right rib area and into her shoulder She states she can only take shallow breaths

## 2014-03-10 NOTE — Progress Notes (Signed)
UR chart review completed.  

## 2014-03-10 NOTE — Lactation Note (Signed)
This note was copied from the chart of [name]. Lactation Consultation Note  Patient Name: April Sundi Slevin CYELY'H Date: 03/10/2014 Reason for consult: Follow-up assessment Baby 50 hours of life. Patient's AICU RN, Stanton Kidney, had called stating that patient's physician was enc supplementation of baby with formula because baby has not had a stool in 50 hours. Baby had 2 bottles of formula/alimentum on day one, and has had 5 voids. Via phone, enc patient's RN, Stanton Kidney to have mom put baby to breast and then use DEBP. Baby had a short feeding and mom states that she is not seeing much colostrum. Enc parents to begin supplementing baby. Plan is for mom to nurse with cues, and at least every 3 hours for now. Then, FOB can offer baby a bottle, beginning with 20 mls and gradually increasing to 30 mls today. Enc mom post-pump after each nursing for 15 minutes, 6-8 times a day. Discussed with mom that her milk could be delayed, but also blood loss can cause difficulty with milk production due to damage of the pituitary. Enc mom to pump consistently for now to see if this will increase her supply especially since the baby has been nursing for short periods of time. Mom states that she is fine with latching the baby. Baby is asleep next to mom and HUGs tag is going off. Enc parents to call out for assistance with latching as needed. Mom aware of OP/[facility] and [place] phone line assistance after D/C.   Mom knocked her pumping flanges in the floor and requested a new kit. Mom doesn't use anything after it has hit the floor. Discussed that there would be an additional charge for the kit and parents were fine with the charge to insurance. Replaced with new kit and placed a basin on over-bed table to prevent new kit from hitting the floor again.   Discussed assessment, interventions, and plan with patient's RN, Stanton Kidney.  Maternal Data    Feeding    LATCH Score/Interventions                      Lactation  Tools Discussed/Used     Consult Status Consult Status: Follow-up Date: 03/11/14 Follow-up type: In-patient    Inocente Salles 03/10/2014, 12:23 PM

## 2014-03-11 LAB — COMPREHENSIVE METABOLIC PANEL
ALK PHOS: 82 U/L (ref 39–117)
ALT: 12 U/L (ref 0–35)
ANION GAP: 0 — AB (ref 5–15)
AST: 31 U/L (ref 0–37)
Albumin: 2 g/dL — ABNORMAL LOW (ref 3.5–5.2)
BUN: 5 mg/dL — ABNORMAL LOW (ref 6–23)
CO2: 22 mmol/L (ref 19–32)
Calcium: 7.7 mg/dL — ABNORMAL LOW (ref 8.4–10.5)
Chloride: 113 mmol/L — ABNORMAL HIGH (ref 96–112)
Creatinine, Ser: 0.5 mg/dL (ref 0.50–1.10)
GFR calc Af Amer: >90 mL/min (ref >90)
GFR calc non Af Amer: >90 mL/min (ref >90)
Glucose, Bld: 104 mg/dL — ABNORMAL HIGH (ref 70–99)
POTASSIUM: 3.3 mmol/L — AB (ref 3.5–5.1)
SODIUM: 134 mmol/L — AB (ref 135–145)
TOTAL PROTEIN: 4.9 g/dL — AB (ref 6.0–8.3)
Total Bilirubin: 0.5 mg/dL (ref 0.3–1.2)

## 2014-03-11 MED ORDER — OXYCODONE-ACETAMINOPHEN 5-325 MG PO TABS: 1.0000 | ORAL_TABLET | Freq: Four times a day (QID) | ORAL | Status: DC | PRN

## 2014-03-11 MED ORDER — IBUPROFEN 600 MG PO TABS: 600.0000 mg | ORAL_TABLET | Freq: Four times a day (QID) | ORAL | Status: DC | PRN

## 2014-03-11 NOTE — Lactation Note (Signed)
This note was copied from the chart of [name]. Lactation Consultation Note Follow up visit made.  Mom states baby is latching but slips down to nipple and becoming sore.  She states breasts are much fuller this AM.  She has not been pumping.  Instructed on supply and demand and importance of good breast emptying every 2-3 hours.  Mom has a medela pump in style at home.  Offered assist latching baby prior to discharge but mom declined.  Reviewed and encouraged outpatient services and mom states she will call prn.  Patient Name: April Courtney ParisYakina Yepes BMWUX'LToday's Date: 03/11/2014     Maternal Data    Feeding Feeding Type: Formula Length of feed: 6 min  LATCH Score/Interventions                      Lactation Tools Discussed/Used     Consult Status      Huston FoleyMOULDEN, Doug Bucklin S 03/11/2014, 11:08 AM

## 2014-03-11 NOTE — Discharge Instructions (Signed)
booklet °

## 2014-03-11 NOTE — Progress Notes (Signed)
Patient ID: [name], female   DOB: 1980/09/06, 34 y.o.   MRN: [id number] #3 afebrile BP normal passing flatus ready for d/c CT scans of abdomen and pelvis basically normal and CT scan of the chest normal For d/c.

## 2014-03-12 LAB — TYPE AND SCREEN
ABO/RH(D): O POS
Antibody Screen: NEGATIVE
UNIT DIVISION: 0
UNIT DIVISION: 0
UNIT DIVISION: 0
Unit division: 0
Unit division: 0
Unit division: 0
Unit division: 0
Unit division: 0
Unit division: 0
Unit division: 0

## 2014-03-12 NOTE — Discharge Summary (Signed)
NAMNadara Eaton:  [name], [name]              ACCOUNT NO.:  [id number]  MEDICAL RECORD NO.:  [id number]  LOCATION:  9131                          FACILITY:  [facility]  PHYSICIAN:  Malachi Prohomas F. Ambrose MantleHenley, M.D. DATE OF BIRTH:  10/06/1980  DATE OF ADMISSION:  03/08/2014 DATE OF DISCHARGE:  03/11/2014                              DISCHARGE SUMMARY   A 34 year old black female, para 2-0-0-2, gravida 3, EDC March 14, 2014, admitted for repeat C-section and sterilization.  The patient underwent the C-section but during the C-section was noted that the uterus was basically a part of the abdominal wall.  The surgery was quite complicated.  The patient received 6 units of blood with a presumed blood loss of about 3500 mL.  Postoperatively, she did well. Her hemoglobin stabilized.  Because of difficulty with breathing on the second postop day and pain with breathing, she underwent CT scan to the abdomen and pelvis and chest, all of which were basically normal.  She began passing gas freely on the second postop day, and on the third postop day is considered to be ready for discharge.  LABORATORY DATA:  Showed an initial hemoglobin of 10.8, followup hemoglobins were 12.4, 11.6, 9.9, and 8.9.  DIAGNOSTIC DATA:  The CT scan on the second postop day showed no evidence of any hemoperitoneum.  Liver and kidney function tests were all normal.  FINAL DIAGNOSES:  Intrauterine pregnancy at 39 weeks, delivered vertex by C-section, extensive adhesions of the uterus to the abdominal wall, excessive bleeding during surgery, sterilization by ligating the left tube, the right tube could not be found.  OPERATION:  Low-transverse cervical C-section with a T extension of the incision, left tubal ligation.  Six units of packed cells and 2 units of plasma.  FINAL CONDITION:  Improved.  INSTRUCTIONS:  Include regular discharge instruction booklet as well as the after visit summary.  Prescriptions for Motrin 600 mg 30 tablets,  1 every 6 hours as needed for pain and Percocet 5/325, 30 tablets, 1 every 6 hours as needed for pain.  She is also to take ferrous sulfate 325 mg twice daily.  Return to the office in 4 or 5 days to have her staples removed and at some point postoperatively, she will need a hysterosalpingogram to confirm that the right tube is absent.  She is to call with any problems.    Malachi Prohomas F. Ambrose MantleHenley, M.D.    TFH/MEDQ  D:  03/11/2014  T:  03/12/2014  Job:  [id number]

## 2014-03-13 ENCOUNTER — Inpatient Hospital Stay (HOSPITAL_COMMUNITY)
Admission: AD | Admit: 2014-03-13 | Discharge: 2014-03-13 | Disposition: A | Discharge status: Home / Self Care | Payer: BLUE CROSS/BLUE SHIELD | Source: Ambulatory Visit | Attending: Obstetrics and Gynecology | Admitting: Obstetrics and Gynecology

## 2014-03-13 ENCOUNTER — Encounter (HOSPITAL_COMMUNITY): Payer: Self-pay | Admitting: *Deleted

## 2014-03-13 DIAGNOSIS — O9989 Other specified diseases and conditions complicating pregnancy, childbirth and the puerperium: Secondary | ICD-10-CM | POA: Diagnosis not present

## 2014-03-13 DIAGNOSIS — R030 Elevated blood-pressure reading, without diagnosis of hypertension: Secondary | ICD-10-CM | POA: Diagnosis not present

## 2014-03-13 DIAGNOSIS — Z3A39 39 weeks gestation of pregnancy: Secondary | ICD-10-CM | POA: Diagnosis not present

## 2014-03-13 DIAGNOSIS — O9089 Other complications of the puerperium, not elsewhere classified: Secondary | ICD-10-CM | POA: Insufficient documentation

## 2014-03-13 DIAGNOSIS — R109 Unspecified abdominal pain: Secondary | ICD-10-CM | POA: Diagnosis present

## 2014-03-13 DIAGNOSIS — K5900 Constipation, unspecified: Secondary | ICD-10-CM | POA: Insufficient documentation

## 2014-03-13 LAB — COMPREHENSIVE METABOLIC PANEL
ALBUMIN: 2.4 g/dL — AB (ref 3.5–5.2)
ALK PHOS: 75 U/L (ref 39–117)
ALT: 16 U/L (ref 0–35)
AST: 15 U/L (ref 0–37)
Anion gap: 6 (ref 5–15)
BUN: 6 mg/dL (ref 6–23)
CALCIUM: 7.9 mg/dL — AB (ref 8.4–10.5)
CO2: 23 mmol/L (ref 19–32)
Chloride: 109 mmol/L (ref 96–112)
Creatinine, Ser: 0.47 mg/dL — ABNORMAL LOW (ref 0.50–1.10)
GFR calc Af Amer: >90 mL/min (ref >90)
Glucose, Bld: 91 mg/dL (ref 70–99)
Potassium: 3.7 mmol/L (ref 3.5–5.1)
Sodium: 138 mmol/L (ref 135–145)
Total Bilirubin: 0.6 mg/dL (ref 0.3–1.2)
Total Protein: 5.6 g/dL — ABNORMAL LOW (ref 6.0–8.3)

## 2014-03-13 LAB — URIC ACID: Uric Acid, Serum: 3.8 mg/dL (ref 2.4–7.0)

## 2014-03-13 LAB — CBC
HCT: 28 % — ABNORMAL LOW (ref 36.0–46.0)
Hemoglobin: 9.5 g/dL — ABNORMAL LOW (ref 12.0–15.0)
MCH: 29.9 pg (ref 26.0–34.0)
MCHC: 33.9 g/dL (ref 30.0–36.0)
MCV: 88.1 fL (ref 78.0–100.0)
PLATELETS: 218 10*3/uL (ref 150–400)
RBC: 3.18 MIL/uL — AB (ref 3.87–5.11)
RDW: 14.5 % (ref 11.5–15.5)
WBC: 11.7 10*3/uL — ABNORMAL HIGH (ref 4.0–10.5)

## 2014-03-13 LAB — LACTATE DEHYDROGENASE: LDH: 204 U/L (ref 94–250)

## 2014-03-13 LAB — URINALYSIS, ROUTINE W REFLEX MICROSCOPIC
BILIRUBIN URINE: NEGATIVE
Glucose, UA: NEGATIVE mg/dL
KETONES UR: NEGATIVE mg/dL
Leukocytes, UA: NEGATIVE
Nitrite: NEGATIVE
Protein, ur: NEGATIVE mg/dL
Specific Gravity, Urine: 1.02 (ref 1.005–1.030)
Urobilinogen, UA: 1 mg/dL (ref 0.0–1.0)
pH: 6 (ref 5.0–8.0)

## 2014-03-13 LAB — URINE MICROSCOPIC-ADD ON

## 2014-03-13 MED ORDER — POLYETHYLENE GLYCOL 3350 17 G PO PACK: 17.0000 g | PACK | Freq: Every day | ORAL | Status: DC

## 2014-03-13 MED ORDER — FLEET ENEMA 7-19 GM/118ML RE ENEM
1.0000 | ENEMA | Freq: Once | RECTAL | Status: AC
  Administered 2014-03-13: 1 via RECTAL

## 2014-03-13 MED ORDER — FUROSEMIDE 20 MG PO TABS
20.0000 mg | ORAL_TABLET | Freq: Once | ORAL | Status: AC
  Administered 2014-03-13: 20 mg via ORAL
  Filled 2014-03-13: qty 1

## 2014-03-13 NOTE — MAU Provider Note (Signed)
History     CSN: [id number]  Arrival date and time: 03/13/14 1010   None     Chief Complaint  Patient presents with  . Post-op Problem   HPI   Ms. [name] is a 34 y.o. female G3P3001 S/P repeat cesarean section on 03/08/2014.  She presents with gas like pain in her abdomen, constipation and swelling in her legs.   She has not had a BM since delivery. She has taken ducolax, and prune juice and is passing gas. However has not had a BM.   No high blood pressure in pregnancy per the patient. Denies HA.   She has not been taking oxycodone much for pain; only one pill since DC home.     OB History    Gravida Para Term Preterm AB TAB SAB Ectopic Multiple Living   '3 3 3      '$ 0 1      Past Medical History  Diagnosis Date  . Complication of anesthesia     "not strong enough during last c-section; I felt qthing" (02/12/2013)  . PONV (postoperative nausea and vomiting)   . Hypertension     "only during pregnancy" (02/12/2013)  . GERD (gastroesophageal reflux disease) 2000-2001    "hospitalized for a week"  . 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 New onset atrial fibrillation 02/11/2013  . Gestational diabetes 2009  . Dysrhythmia     palpitations during a neck infection-dx'd with Atrial fib- no problems snice then.    Past Surgical History  Procedure Laterality Date  . Cesarean section  2006; 2009  . Keloid excision Left 1990's    "back of my earlobe" (02/12/2013)  . Cesarean section Left 03/08/2014    Procedure: REPEAT CESAREAN SECTION with left tubal ligation; LOW TRANSVERSE ABDOMINAL INCISION,  T UTERINE INCISION;  Surgeon: Melina Schools, MD;  Location: [facility];  Service: Obstetrics;  Laterality: Left;    Family History  Problem Relation Age of Onset  . Diabetes Mellitus II Other   . Hypertension Other   . CAD Father 44    History  Substance Use Topics  . Smoking status: Former Smoker -- 1.50 packs/day for 6  years  . Smokeless tobacco: Never Used     Comment: 02/12/2013 "quit smoking in ~ 2006"  . Alcohol Use: No     Comment: 02/12/2013 "drink occasionally; couple times/yr    Allergies: No Known Allergies  Prescriptions prior to admission  Medication Sig Dispense Refill Last Dose  . Bisacodyl (DULCOLAX PO) Take 2 tablets by mouth daily as needed (for bowl movements).   03/12/2014 at Unknown time  . calcium carbonate (TUMS - DOSED IN MG ELEMENTAL CALCIUM) 500 MG chewable tablet Chew 1 tablet by mouth 5 (five) times daily as needed for indigestion or heartburn.    Past Week at Unknown time  . ibuprofen (ADVIL,MOTRIN) 600 MG tablet Take 1 tablet (600 mg total) by mouth every 6 (six) hours as needed for mild pain. 30 tablet 0 03/12/2014 at Unknown time  . IRON PO Take 1 tablet by mouth daily.   Past Week at Unknown time  . oxyCODONE-acetaminophen (PERCOCET/ROXICET) 5-325 MG per tablet Take 1 tablet by mouth every 6 (six) hours as needed (for pain scale less than 7). 30 tablet 0 03/12/2014 at Unknown time  . prenatal vitamin w/FE, FA (PRENATAL 1 + 1) 27-1 MG TABS tablet Take 1 tablet by mouth daily.    Past  Week at Unknown time   Results for orders placed or performed during the hospital encounter of 03/13/14 (from the past 48 hour(s))  CBC     Status: Abnormal   Collection Time: 03/13/14 11:19 AM  Result Value Ref Range   WBC 11.7 (H) 4.0 - 10.5 K/uL   RBC 3.18 (L) 3.87 - 5.11 MIL/uL   Hemoglobin 9.5 (L) 12.0 - 15.0 g/dL   HCT 28.0 (L) 36.0 - 46.0 %   MCV 88.1 78.0 - 100.0 fL   MCH 29.9 26.0 - 34.0 pg   MCHC 33.9 30.0 - 36.0 g/dL   RDW 14.5 11.5 - 15.5 %   Platelets 218 150 - 400 K/uL  Comprehensive metabolic panel     Status: Abnormal   Collection Time: 03/13/14 11:19 AM  Result Value Ref Range   Sodium 138 135 - 145 mmol/L   Potassium 3.7 3.5 - 5.1 mmol/L   Chloride 109 96 - 112 mmol/L   CO2 23 19 - 32 mmol/L   Glucose, Bld 91 70 - 99 mg/dL   BUN 6 6 - 23 mg/dL   Creatinine, Ser 0.47 (L)  0.50 - 1.10 mg/dL   Calcium 7.9 (L) 8.4 - 10.5 mg/dL   Total Protein 5.6 (L) 6.0 - 8.3 g/dL   Albumin 2.4 (L) 3.5 - 5.2 g/dL   AST 15 0 - 37 U/L   ALT 16 0 - 35 U/L   Alkaline Phosphatase 75 39 - 117 U/L   Total Bilirubin 0.6 0.3 - 1.2 mg/dL   GFR calc non Af Amer >90 >90 mL/min   GFR calc Af Amer >90 >90 mL/min    Comment: (NOTE) The eGFR has been calculated using the CKD EPI equation. This calculation has not been validated in all clinical situations. eGFR's persistently <90 mL/min signify possible Chronic Kidney Disease.    Anion gap 6 5 - 15  Uric acid     Status: None   Collection Time: 03/13/14 11:19 AM  Result Value Ref Range   Uric Acid, Serum 3.8 2.4 - 7.0 mg/dL  Lactate dehydrogenase     Status: None   Collection Time: 03/13/14 11:19 AM  Result Value Ref Range   LDH 204 94 - 250 U/L  Urinalysis, Routine w reflex microscopic     Status: Abnormal   Collection Time: 03/13/14 12:45 PM  Result Value Ref Range   Color, Urine YELLOW YELLOW   APPearance CLEAR CLEAR   Specific Gravity, Urine 1.020 1.005 - 1.030   pH 6.0 5.0 - 8.0   Glucose, UA NEGATIVE NEGATIVE mg/dL   Hgb urine dipstick TRACE (A) NEGATIVE   Bilirubin Urine NEGATIVE NEGATIVE   Ketones, ur NEGATIVE NEGATIVE mg/dL   Protein, ur NEGATIVE NEGATIVE mg/dL   Urobilinogen, UA 1.0 0.0 - 1.0 mg/dL   Nitrite NEGATIVE NEGATIVE   Leukocytes, UA NEGATIVE NEGATIVE  Urine microscopic-add on     Status: None   Collection Time: 03/13/14 12:45 PM  Result Value Ref Range   Squamous Epithelial / LPF RARE RARE   RBC / HPF 0-2 <3 RBC/hpf    Review of Systems  Constitutional: Negative for fever and chills.  Eyes: Positive for blurred vision (Occasional ).  Respiratory: Positive for shortness of breath (Occasional; in the morning. ).   Cardiovascular: Positive for leg swelling.  Gastrointestinal: Positive for nausea, abdominal pain (Left-Mid lower abdominal discomfort. ) and constipation.  Genitourinary: Negative for  dysuria, urgency, frequency and hematuria.  Neurological: Negative for headaches.  Physical Exam   Blood pressure 141/87, pulse 71, temperature 98.3 F (36.8 C), temperature source Oral, resp. rate 18, height $RemoveBe'5\' 10"'oWcrdXoNs$  (1.778 m), weight 110.904 kg (244 lb 8 oz), last menstrual period 05/31/2013, SpO2 99 %, unknown if currently breastfeeding.  Physical Exam  Constitutional: She is oriented to person, place, and time. She appears well-developed and well-nourished. No distress.  HENT:  Head: Normocephalic.  Eyes: Pupils are equal, round, and reactive to light.  Neck: Neck supple.  Cardiovascular: Normal rate and normal heart sounds.   Respiratory: Effort normal and breath sounds normal. No respiratory distress. She has no wheezes. She has no rales.  GI: Soft. Normal appearance and bowel sounds are normal. There is generalized tenderness. There is no rigidity, no rebound and no guarding.  Musculoskeletal: Normal range of motion.       Right ankle: She exhibits swelling (+1 pitting edema ).       Left ankle: She exhibits swelling (+1 pitting edema ).       Right lower leg: She exhibits edema (+ 1 pitting edema ).       Left lower leg: She exhibits edema (+ pitting edema ).  Neurological: She is alert and oriented to person, place, and time. She has normal reflexes.  Negative clonus   Skin: Skin is warm. She is not diaphoretic. There is pallor.  Psychiatric: Her behavior is normal.    MAU Course  Procedures  None  MDM Fleets enema X 1 Large BM   Discussed patient with Dr. Willis Modena.  Preeclampsia labs WNL Patient and family discussed concerns about occasional SOB, and leg swelling. Discussed concern with Dr. Meisinger> 20 mg of lasix given PO at discharge.   Assessment and Plan   A:  1. Constipation, unspecified constipation type   2. Blood pressure elevated without history of HTN     P:  Discharge home in stable condition RX: miralax Discussed options for constipation   Increase water intake  Preeclampsia precautions  Follow up with Dr. Willis Modena as scheduled   Darrelyn Hillock Rasch, NP 03/13/2014 5:37 PM

## 2014-03-13 NOTE — MAU Note (Signed)
Pt states here for constipation. No bowel mvmt since last Sunday. Having intestinal cramping. Has tried suppository as well as oral laxatives with no success.

## 2014-03-13 NOTE — Discharge Instructions (Signed)

## 2014-03-18 ENCOUNTER — Encounter (HOSPITAL_COMMUNITY): Payer: Self-pay | Admitting: *Deleted

## 2014-03-18 ENCOUNTER — Inpatient Hospital Stay (HOSPITAL_COMMUNITY)
Admission: AD | Admit: 2014-03-18 | Discharge: 2014-03-18 | Disposition: A | Discharge status: Home / Self Care | Payer: BLUE CROSS/BLUE SHIELD | Source: Ambulatory Visit | Attending: Obstetrics and Gynecology | Admitting: Obstetrics and Gynecology

## 2014-03-18 DIAGNOSIS — I158 Other secondary hypertension: Secondary | ICD-10-CM | POA: Insufficient documentation

## 2014-03-18 DIAGNOSIS — O139 Gestational [pregnancy-induced] hypertension without significant proteinuria, unspecified trimester: Secondary | ICD-10-CM

## 2014-03-18 DIAGNOSIS — O1093 Unspecified pre-existing hypertension complicating the puerperium: Secondary | ICD-10-CM | POA: Diagnosis not present

## 2014-03-18 DIAGNOSIS — O9089 Other complications of the puerperium, not elsewhere classified: Secondary | ICD-10-CM | POA: Insufficient documentation

## 2014-03-18 DIAGNOSIS — Z87891 Personal history of nicotine dependence: Secondary | ICD-10-CM | POA: Diagnosis not present

## 2014-03-18 DIAGNOSIS — R030 Elevated blood-pressure reading, without diagnosis of hypertension: Secondary | ICD-10-CM | POA: Diagnosis present

## 2014-03-18 LAB — URINALYSIS, ROUTINE W REFLEX MICROSCOPIC
BILIRUBIN URINE: NEGATIVE
Glucose, UA: NEGATIVE mg/dL
KETONES UR: NEGATIVE mg/dL
Leukocytes, UA: NEGATIVE
NITRITE: NEGATIVE
PROTEIN: NEGATIVE mg/dL
Specific Gravity, Urine: 1.01 (ref 1.005–1.030)
UROBILINOGEN UA: 0.2 mg/dL (ref 0.0–1.0)
pH: 6 (ref 5.0–8.0)

## 2014-03-18 LAB — COMPREHENSIVE METABOLIC PANEL
ALT: 15 U/L (ref 0–35)
AST: 15 U/L (ref 0–37)
Albumin: 3 g/dL — ABNORMAL LOW (ref 3.5–5.2)
Alkaline Phosphatase: 76 U/L (ref 39–117)
Anion gap: 5 (ref 5–15)
BUN: 6 mg/dL (ref 6–23)
CO2: 26 mmol/L (ref 19–32)
Calcium: 8.6 mg/dL (ref 8.4–10.5)
Chloride: 108 mmol/L (ref 96–112)
Creatinine, Ser: 0.56 mg/dL (ref 0.50–1.10)
GFR calc Af Amer: >90 mL/min (ref >90)
GFR calc non Af Amer: >90 mL/min (ref >90)
Glucose, Bld: 91 mg/dL (ref 70–99)
Potassium: 4.1 mmol/L (ref 3.5–5.1)
Sodium: 139 mmol/L (ref 135–145)
Total Bilirubin: 0.3 mg/dL (ref 0.3–1.2)
Total Protein: 7 g/dL (ref 6.0–8.3)

## 2014-03-18 LAB — CBC
HCT: 31.8 % — ABNORMAL LOW (ref 36.0–46.0)
Hemoglobin: 10.2 g/dL — ABNORMAL LOW (ref 12.0–15.0)
MCH: 28.7 pg (ref 26.0–34.0)
MCHC: 32.1 g/dL (ref 30.0–36.0)
MCV: 89.6 fL (ref 78.0–100.0)
Platelets: 390 10*3/uL (ref 150–400)
RBC: 3.55 MIL/uL — ABNORMAL LOW (ref 3.87–5.11)
RDW: 14.4 % (ref 11.5–15.5)
WBC: 10.3 10*3/uL (ref 4.0–10.5)

## 2014-03-18 LAB — URIC ACID: Uric Acid, Serum: 5.2 mg/dL (ref 2.4–7.0)

## 2014-03-18 LAB — PROTEIN / CREATININE RATIO, URINE
Creatinine, Urine: 78 mg/dL
Total Protein, Urine: <6 mg/dL

## 2014-03-18 LAB — URINE MICROSCOPIC-ADD ON

## 2014-03-18 LAB — LACTATE DEHYDROGENASE: LDH: 214 U/L (ref 94–250)

## 2014-03-18 MED ORDER — LABETALOL HCL 100 MG PO TABS: 100.0000 mg | ORAL_TABLET | Freq: Two times a day (BID) | ORAL | Status: AC

## 2014-03-18 MED ORDER — BUTALBITAL-APAP-CAFFEINE 50-325-40 MG PO TABS
1.0000 | ORAL_TABLET | Freq: Once | ORAL | Status: AC
  Administered 2014-03-18: 1 via ORAL
  Filled 2014-03-18: qty 1

## 2014-03-18 NOTE — Discharge Instructions (Signed)
°Hypertension During Pregnancy °Hypertension is also called high blood pressure. Blood pressure moves blood in your body. Sometimes, the force that moves the blood becomes too strong. When you are pregnant, this condition should be watched carefully. It can cause problems for you and your baby. °HOME CARE  °· Make and keep all of your doctor visits. °· Take medicine as told by your doctor. Tell your doctor about all medicines you take. °· Eat very little salt. °· Exercise regularly. °· Do not drink alcohol. °· Do not smoke. °· Do not have drinks with caffeine. °· Lie on your left side when resting. °· Your health care provider may ask you to take one low-dose aspirin (81mg) each day. °GET HELP RIGHT AWAY IF: °· You have bad belly (abdominal) pain. °· You have sudden puffiness (swelling) in the hands, ankles, or face. °· You gain 4 pounds (1.8 kilograms) or more in 1 week. °· You throw up (vomit) repeatedly. °· You have bleeding from the vagina. °· You do not feel the baby moving as much. °· You have a headache. °· You have blurred or double vision. °· You have muscle twitching or spasms. °· You have shortness of breath. °· You have blue fingernails and lips. °· You have blood in your pee (urine). °MAKE SURE YOU: °· Understand these instructions. °· Will watch your condition. °· Will get help right away if you are not doing well or get worse. °Document Released: 02/10/2010 Document Revised: 05/25/2013 Document Reviewed: 08/07/2012 °ExitCare® Patient Information ©2015 [employer]. This information is not intended to replace advice given to you by your health care provider. Make sure you discuss any questions you have with your health care provider. ° ° °Preeclampsia and Eclampsia °Preeclampsia is a serious condition that develops only during pregnancy. It is also called toxemia of pregnancy. This condition causes high blood pressure along with other symptoms, such as swelling and headaches. These may develop as the  condition gets worse. Preeclampsia may occur 20 weeks or later into your pregnancy.  °Diagnosing and treating preeclampsia early is very important. If not treated early, it can cause serious problems for you and your baby. One problem it can lead to is eclampsia, which is a condition that causes muscle jerking or shaking (convulsions) in the mother. Delivering your baby is the best treatment for preeclampsia or eclampsia.  °RISK FACTORS °The cause of preeclampsia is not known. You may be more likely to develop preeclampsia if you have certain risk factors. These include:  °· Being pregnant for the first time. °· Having preeclampsia in a past pregnancy. °· Having a family history of preeclampsia. °· Having high blood pressure. °· Being pregnant with twins or triplets. °· Being 35 or older. °· Being African American. °· Having kidney disease or diabetes. °· Having medical conditions such as lupus or blood diseases. °· Being very overweight (obese). °SIGNS AND SYMPTOMS  °The earliest signs of preeclampsia are: °· High blood pressure. °· Increased protein in your urine. Your health care provider will check for this at every prenatal visit. °Other symptoms that can develop include:  °· Severe headaches. °· Sudden weight gain. °· Swelling of your hands, face, legs, and feet. °· Feeling sick to your stomach (nauseous) and throwing up (vomiting). °· Vision problems (blurred or double vision). °· Numbness in your face, arms, legs, and feet. °· Dizziness. °· Slurred speech. °· Sensitivity to bright lights. °· Abdominal pain. °DIAGNOSIS  °There are no screening tests for preeclampsia. Your health   care provider will ask you about symptoms and check for signs of preeclampsia during your prenatal visits. You may also have tests, including: °· Urine testing. °· Blood testing. °· Checking your baby's heart rate. °· Checking the health of your baby and your placenta using images created with sound waves (ultrasound). °TREATMENT    °You can work out the best treatment approach together with your health care provider. It is very important to keep all prenatal appointments. If you have an increased risk of preeclampsia, you may need more frequent prenatal exams. °· Your health care provider may prescribe bed rest. °· You may have to eat as little salt as possible. °· You may need to take medicine to lower your blood pressure if the condition does not respond to more conservative measures. °· You may need to stay in the hospital if your condition is severe. There, treatment will focus on controlling your blood pressure and fluid retention. You may also need to take medicine to prevent seizures. °· If the condition gets worse, your baby may need to be delivered early to protect you and the baby. You may have your labor started with medicine (be induced), or you may have a cesarean delivery. °· Preeclampsia usually goes away after the baby is born. °HOME CARE INSTRUCTIONS  °· Only take over-the-counter or prescription medicines as directed by your health care provider. °· Lie on your left side while resting. This keeps pressure off your baby. °· Elevate your feet while resting. °· Get regular exercise. Ask your health care provider what type of exercise is safe for you. °· Avoid caffeine and alcohol. °· Do not smoke. °· Drink 6-8 glasses of water every day. °· Eat a balanced diet that is low in salt. Do not add salt to your food. °· Avoid stressful situations as much as possible. °· Get plenty of rest and sleep. °· Keep all prenatal appointments and tests as scheduled. °SEEK MEDICAL CARE IF: °· You are gaining more weight than expected. °· You have any headaches, abdominal pain, or nausea. °· You are bruising more than usual. °· You feel dizzy or light-headed. °SEEK IMMEDIATE MEDICAL CARE IF:  °· You develop sudden or severe swelling anywhere in your body. This usually happens in the legs. °· You gain 5 lb (2.3 kg) or more in a week. °· You have a  severe headache, dizziness, problems with your vision, or confusion. °· You have severe abdominal pain. °· You have lasting nausea or vomiting. °· You have a seizure. °· You have trouble moving any part of your body. °· You develop numbness in your body. °· You have trouble speaking. °· You have any abnormal bleeding. °· You develop a stiff neck. °· You pass out. °MAKE SURE YOU:  °· Understand these instructions. °· Will watch your condition. °· Will get help right away if you are not doing well or get worse. °Document Released: 01/06/2000 Document Revised: 01/13/2013 Document Reviewed: 10/31/2012 °ExitCare® Patient Information ©2015 [employer]. This information is not intended to replace advice given to you by your health care provider. Make sure you discuss any questions you have with your health care provider. ° °

## 2014-03-18 NOTE — MAU Provider Note (Signed)
History     CSN: [id number]  Arrival date and time: 03/18/14 1103   None     Chief Complaint  Patient presents with  . Hypertension   HPI  34 y.o. G3P3001 Presents to the MAU with complaint of high blood pressure .Pt had elevated b/p with smart start nurse 177/100. C/o mild headache today as well. Post partum 03/08/14. Denies visual changes. No hypertension during pregnancy. Reports swelling in her legs has gotten better since delivery but she is still swollen.  Past Medical History  Diagnosis Date  . Complication of anesthesia     "not strong enough during last c-section; I felt qthing" (02/12/2013)  . PONV (postoperative nausea and vomiting)   . Hypertension     "only during pregnancy" (02/12/2013)  . GERD (gastroesophageal reflux disease) 2000-2001    "hospitalized for a week"  . 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 New onset atrial fibrillation 02/11/2013  . Gestational diabetes 2009  . Dysrhythmia     palpitations during a neck infection-dx'd with Atrial fib- no problems snice then.    Past Surgical History  Procedure Laterality Date  . Cesarean section  2006; 2009  . Keloid excision Left 1990's    "back of my earlobe" (02/12/2013)  . Cesarean section Left 03/08/2014    Procedure: REPEAT CESAREAN SECTION with left tubal ligation; LOW TRANSVERSE ABDOMINAL INCISION,  T UTERINE INCISION;  Surgeon: Bing Plumehomas F Henley, MD;  Location: [facility];  Service: Obstetrics;  Laterality: Left;    Family History  Problem Relation Age of Onset  . Diabetes Mellitus II Other   . Hypertension Other   . CAD Father 8154    History  Substance Use Topics  . Smoking status: Former Smoker -- 1.50 packs/day for 6 years  . Smokeless tobacco: Never Used     Comment: 02/12/2013 "quit smoking in ~ 2006"  . Alcohol Use: No     Comment: 02/12/2013 "drink occasionally; couple times/yr    Allergies: No Known Allergies  Prescriptions prior to admission   Medication Sig Dispense Refill Last Dose  . Bisacodyl (DULCOLAX PO) Take 2 tablets by mouth daily as needed (for bowl movements).   03/12/2014 at Unknown time  . calcium carbonate (TUMS - DOSED IN MG ELEMENTAL CALCIUM) 500 MG chewable tablet Chew 1 tablet by mouth 5 (five) times daily as needed for indigestion or heartburn.    Past Week at Unknown time  . IRON PO Take 1 tablet by mouth daily.   Past Week at Unknown time  . polyethylene glycol (MIRALAX / GLYCOLAX) packet Take 17 g by mouth daily. 14 each 0     Review of Systems  Constitutional: Negative for fever and chills.  HENT:       Left temporal  Eyes: Negative for blurred vision, double vision, photophobia and pain.  Respiratory: Negative for cough and shortness of breath.   Cardiovascular: Positive for leg swelling. Negative for chest pain.  Gastrointestinal: Negative for nausea, vomiting and abdominal pain.  Genitourinary: Negative for dysuria and urgency.  Musculoskeletal: Negative.   Neurological: Positive for headaches. Negative for dizziness, sensory change and loss of consciousness.  Endo/Heme/Allergies: Negative.   Psychiatric/Behavioral: Negative.    Physical Exam   Blood pressure 137/88, pulse 51, temperature 98.1 F (36.7 C), temperature source Oral, resp. rate 18, height 5\' 10"  (1.778 m), weight 103.93 kg (229 lb 2 oz), last menstrual period 05/31/2013, SpO2 100 %, currently breastfeeding.  Physical Exam  Nursing note and vitals reviewed. Constitutional: She is oriented to person, place, and time. She appears well-developed and well-nourished. No distress.  HENT:  Head: Normocephalic and atraumatic.  Neck: Normal range of motion.  Cardiovascular: Normal rate.   Respiratory: Effort normal. No respiratory distress.  GI: Soft.  Musculoskeletal: Normal range of motion. She exhibits edema.  Bilateral swelling LE's  Neurological: She is alert and oriented to person, place, and time. She has normal reflexes. She  displays normal reflexes.  Skin: Skin is warm and dry.  Psychiatric: She has a normal mood and affect. Her behavior is normal. Judgment and thought content normal.    MAU Course  Procedures  Preeclampsia Labs Serial B/p's Fioricet 1 tab Results for orders placed or performed during the hospital encounter of 03/18/14 (from the past 24 hour(s))  Urinalysis, Routine w reflex microscopic     Status: Abnormal   Collection Time: 03/18/14 11:22 AM  Result Value Ref Range   Color, Urine YELLOW YELLOW   APPearance CLEAR CLEAR   Specific Gravity, Urine 1.010 1.005 - 1.030   pH 6.0 5.0 - 8.0   Glucose, UA NEGATIVE NEGATIVE mg/dL   Hgb urine dipstick SMALL (A) NEGATIVE   Bilirubin Urine NEGATIVE NEGATIVE   Ketones, ur NEGATIVE NEGATIVE mg/dL   Protein, ur NEGATIVE NEGATIVE mg/dL   Urobilinogen, UA 0.2 0.0 - 1.0 mg/dL   Nitrite NEGATIVE NEGATIVE   Leukocytes, UA NEGATIVE NEGATIVE  Urine microscopic-add on     Status: Abnormal   Collection Time: 03/18/14 11:22 AM  Result Value Ref Range   Squamous Epithelial / LPF FEW (A) RARE   RBC / HPF 0-2 <3 RBC/hpf   Bacteria, UA RARE RARE  Protein / creatinine ratio, urine     Status: None   Collection Time: 03/18/14 11:22 AM  Result Value Ref Range   Creatinine, Urine 78.00 mg/dL   Total Protein, Urine <6 mg/dL   Protein Creatinine Ratio        0.00 - 0.15  CBC     Status: Abnormal   Collection Time: 03/18/14 12:15 PM  Result Value Ref Range   WBC 10.3 4.0 - 10.5 K/uL   RBC 3.55 (L) 3.87 - 5.11 MIL/uL   Hemoglobin 10.2 (L) 12.0 - 15.0 g/dL   HCT 16.1 (L) 09.6 - 04.5 %   MCV 89.6 78.0 - 100.0 fL   MCH 28.7 26.0 - 34.0 pg   MCHC 32.1 30.0 - 36.0 g/dL   RDW 40.9 81.1 - 91.4 %   Platelets 390 150 - 400 K/uL  Comprehensive metabolic panel     Status: Abnormal   Collection Time: 03/18/14 12:15 PM  Result Value Ref Range   Sodium 139 135 - 145 mmol/L   Potassium 4.1 3.5 - 5.1 mmol/L   Chloride 108 96 - 112 mmol/L   CO2 26 19 - 32 mmol/L    Glucose, Bld 91 70 - 99 mg/dL   BUN 6 6 - 23 mg/dL   Creatinine, Ser 7.82 0.50 - 1.10 mg/dL   Calcium 8.6 8.4 - 95.6 mg/dL   Total Protein 7.0 6.0 - 8.3 g/dL   Albumin 3.0 (L) 3.5 - 5.2 g/dL   AST 15 0 - 37 U/L   ALT 15 0 - 35 U/L   Alkaline Phosphatase 76 39 - 117 U/L   Total Bilirubin 0.3 0.3 - 1.2 mg/dL   GFR calc non Af Amer >90 >90 mL/min   GFR calc Af Amer >90 >90 mL/min  Anion gap 5 5 - 15  Lactate dehydrogenase     Status: None   Collection Time: 03/18/14 12:15 PM  Result Value Ref Range   LDH 214 94 - 250 U/L  Uric acid     Status: None   Collection Time: 03/18/14 12:15 PM  Result Value Ref Range   Uric Acid, Serum 5.2 2.4 - 7.0 mg/dL   B/P's  161/09 604/54 161/83 162/91 165/87 Assessment and Plan  Postpartum- 11 days Hypertension  Plan Consulted with Dr Jackelyn Knife; Will d/c pt on labetolol  BID F/U next week with Dr Renaye Rakers 03/18/2014, 11:47 AM

## 2014-03-18 NOTE — MAU Note (Addendum)
Pt had elevated b/p with smart start nurse 177/100. C/o mild headache today as well. Post partum 03/08/14. Denies visual changes. No hypertension during pregnancy. Reports swelling in her legs has gotten better since delivery but she is still swollen.

## 2015-08-01 IMAGING — CT CT NECK W/ CM
3 of 4 series · 13 of 33 positions shown, 16 images · IV contrast (APPLIED)
Comparison: None.

CLINICAL DATA: Left-sided neck pain, concern for peritonsillar
abscess

EXAM:
CT NECK WITH CONTRAST
TECHNIQUE: Multidetector CT imaging of the neck was performed using the
standard protocol following the bolus administration of intravenous
contrast.
CONTRAST:  75mL OMNIPAQUE IOHEXOL 300 MG/ML  SOLN

[Series 5: coronal st · coronal · 0.42mm/px · 3 of 111 slices shown]
[im 27/111  bone]
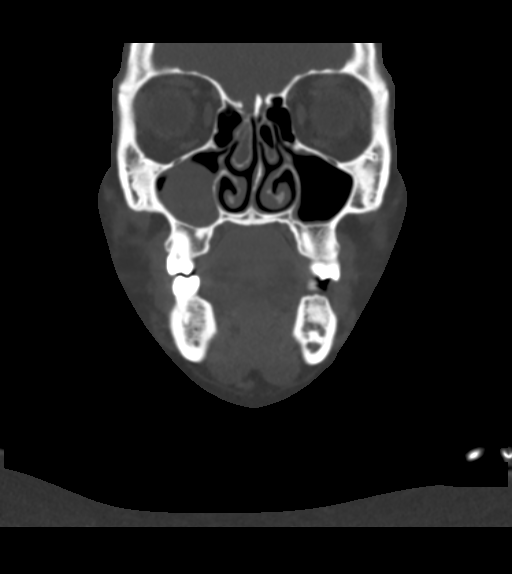
[im 46/111  bone]
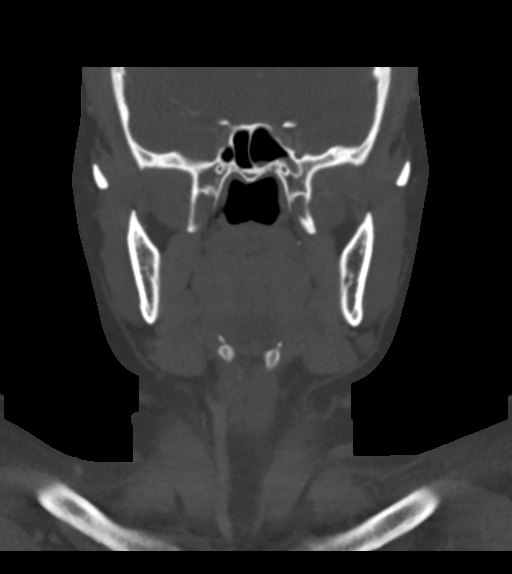
[im 65/111  bone]
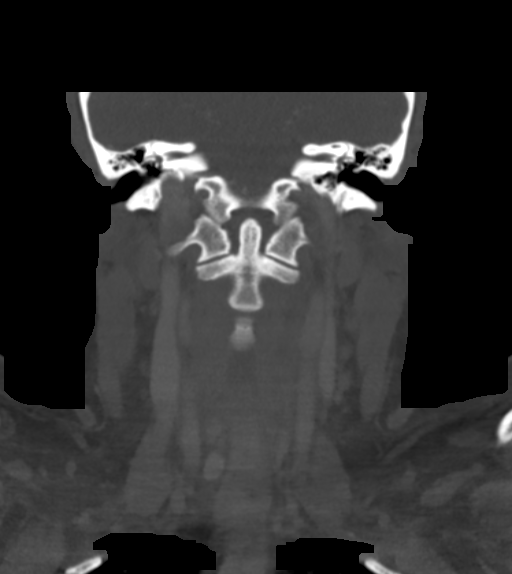

[Series 6: sagittal st · sagittal · 0.38mm/px · 5 of 83 slices shown, 6 images]
[im 28/83  bone]
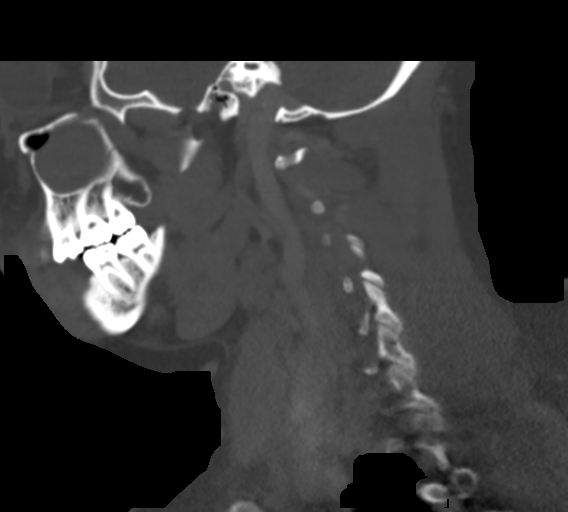
[im 35/83  bone]
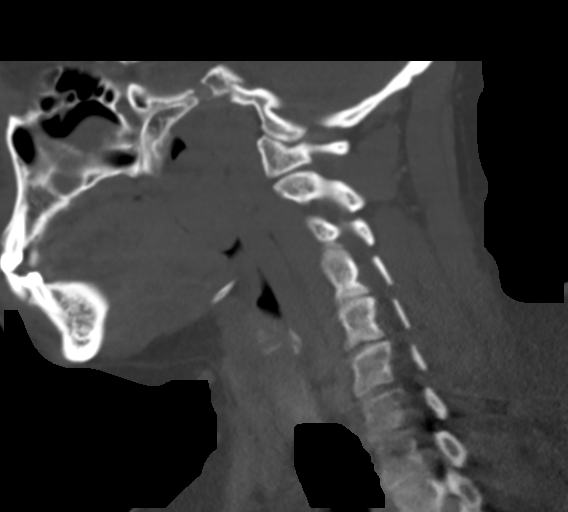
[im 42/83  soft-tissue]
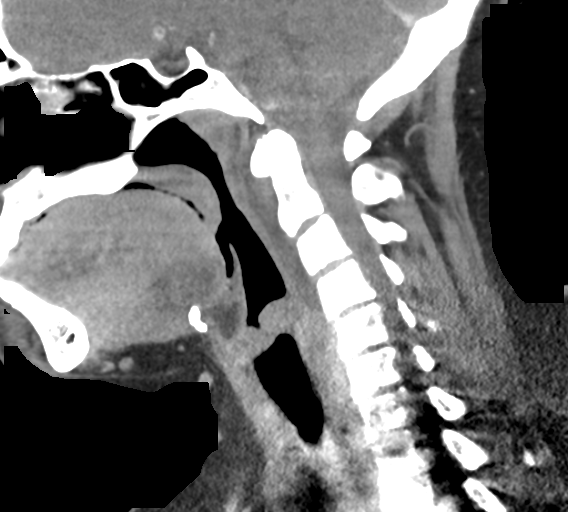
[im 42/83  bone]
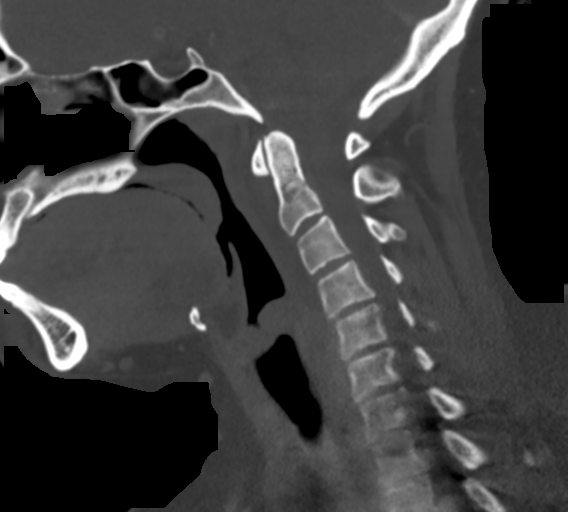
[im 48/83  bone]
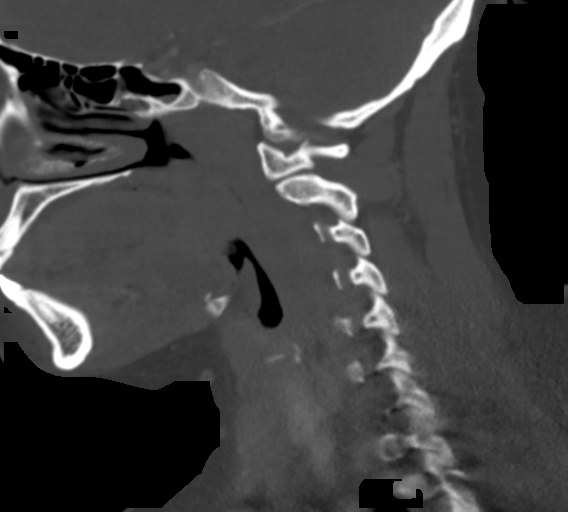
[im 55/83  bone]
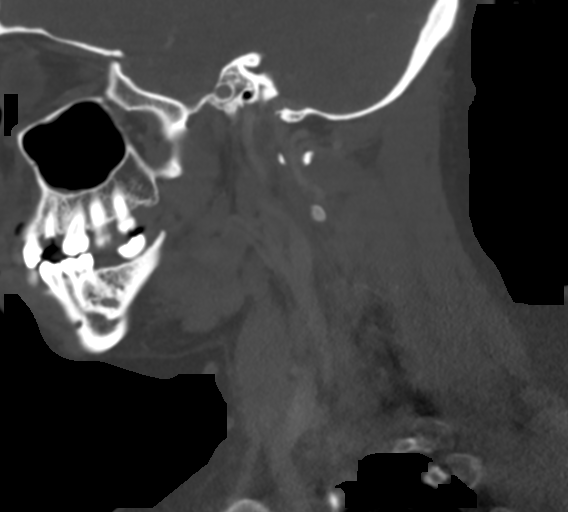

[Series 7: orthogonal st · axial · 0.39mm/px · z∈[+1427,+1580]mm · 5 of 114 slices shown, 7 images]
[im 17/114  soft-tissue]
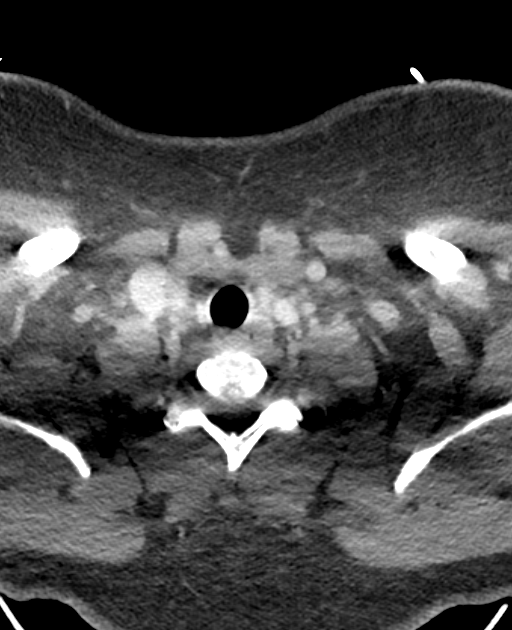
[im 17/114  bone]
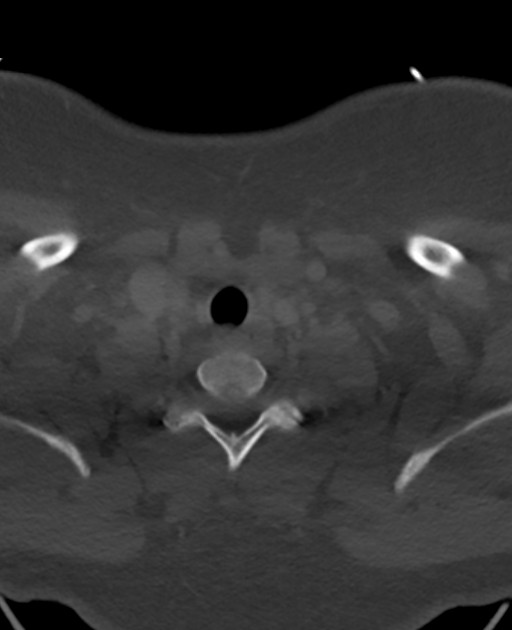
[im 33/114  bone]
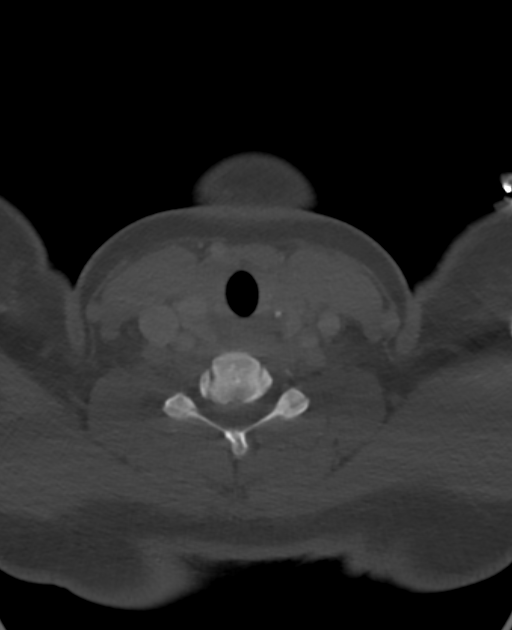
[im 65/114  bone]
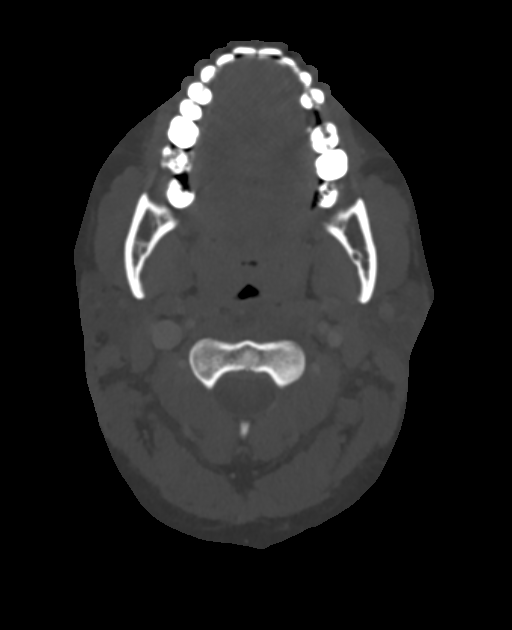
[im 81/114  bone]
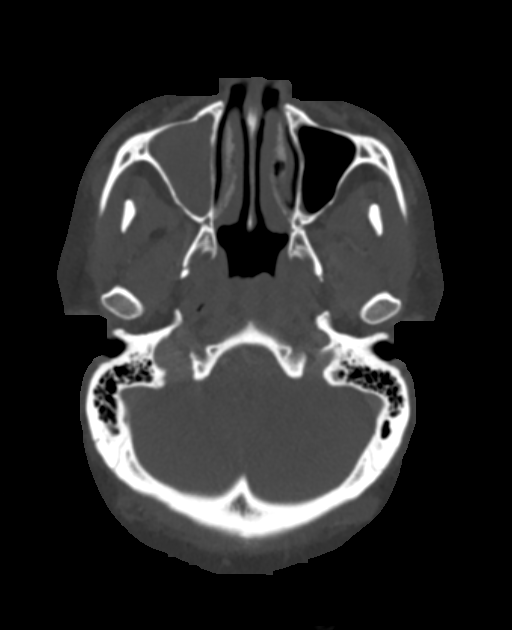
[im 97/114  soft-tissue]
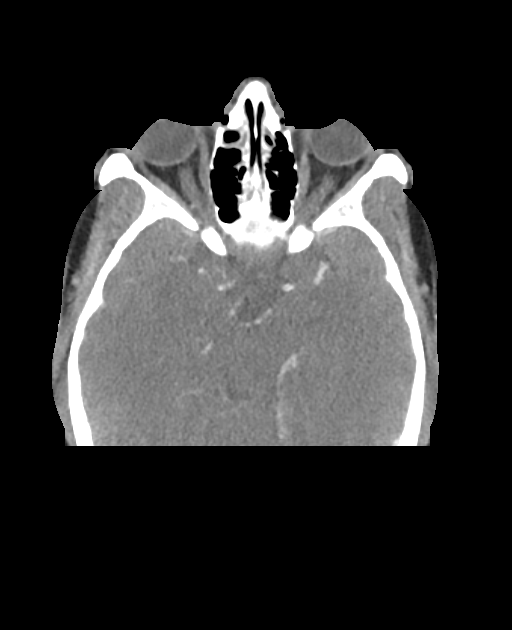
[im 97/114  bone]
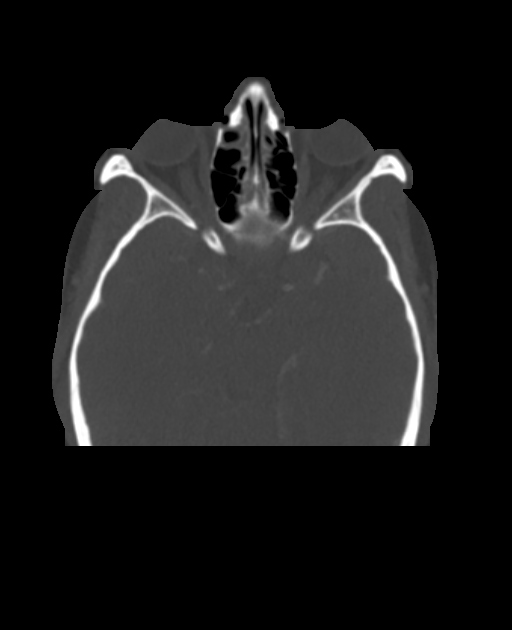

[13 of 33 positions shown; findings below may reference images not displayed]

FINDINGS: The visualized portions of the brain are unremarkable.

Orbits are within normal limits.

There is partial opacification of the right frontal sinus and
anterior right ethmoidal air cells as well. No mastoid effusion.

Oral cavity is within normal limits without evidence of loculated
fluid collection or mass lesion. Tonsils are symmetric. No tonsillar
or peritonsillar abscess identified. Parapharyngeal fat is
preserved.

The nasopharynx is within normal limits.

A somewhat ill-defined hypodense collection measuring
(transverse) x 1.0 (AP) x 6.0 (craniocaudad) cm is seen within the
retropharyngeal space (series 2, image 53). This collection extends
from the C1-2 articulations inferiorly to the level of C4. The node
definite rim enhancement seen, however, finding is concerning for
retropharyngeal abscess. No definite associated intravascular
thrombosis to suggest associated thrombophlebitis identified. The
airway remains midline and widely patent.

The remainder of the hypopharynx is unremarkable. The supraglottic
larynx and subglottic larynx is are normal. True vocal cords are
symmetric in appearance without abnormality.

The thyroid gland is within normal limits.

No pathologically enlarged lymph nodes are identified within the
neck.

Visualized lung apices are normal.

Straightening of the normal cervical lordosis is likely related to
patient positioning. No acute osseous abnormality. No focal lytic or
blastic osseous lesions.
IMPRESSION: 1. Ill defined hypodense collection within the retropharyngeal space
extending from the C1-2 articulation inferiorly to the level of C4,
most compatible with early/developing retropharyngeal abscess. The
airway remains widely patent. No definite venous thrombosis
identified to suggest associated thrombophlebitis.
2. Right frontoethmoidal and maxillary sinus disease.

## 2015-08-24 ENCOUNTER — Ambulatory Visit (HOSPITAL_COMMUNITY)
Admission: EM | Admit: 2015-08-24 | Discharge: 2015-08-24 | Disposition: A | Discharge status: Home / Self Care | Payer: BLUE CROSS/BLUE SHIELD | Attending: Family Medicine | Admitting: Family Medicine

## 2015-08-24 ENCOUNTER — Encounter (HOSPITAL_COMMUNITY): Payer: Self-pay | Admitting: Emergency Medicine

## 2015-08-24 DIAGNOSIS — Z87891 Personal history of nicotine dependence: Secondary | ICD-10-CM | POA: Insufficient documentation

## 2015-08-24 DIAGNOSIS — J029 Acute pharyngitis, unspecified: Secondary | ICD-10-CM | POA: Insufficient documentation

## 2015-08-24 LAB — POCT RAPID STREP A: Streptococcus, Group A Screen (Direct): NEGATIVE

## 2015-08-24 NOTE — ED Provider Notes (Signed)
CSN: [id number]     Arrival date & time 08/24/15  1007 History   First MD Initiated Contact with Patient 08/24/15 1040     Chief Complaint  Patient presents with  . Sore Throat   (Consider location/radiation/quality/duration/timing/severity/associated sxs/prior Treatment) The history is provided by the patient.  Sore Throat  This is a new problem. Episode onset: Approximately 1 week ago. The problem occurs constantly. The problem has been gradually worsening. Pertinent negatives include no chest pain, no abdominal pain, no headaches and no shortness of breath. Associated symptoms comments: Positive for tender lymph nodes, coughing with green mucous, fever and chills. The symptoms are aggravated by swallowing. Nothing relieves the symptoms. She has tried nothing for the symptoms.    Past Medical History:  Diagnosis Date  . Complication of anesthesia    "not strong enough during last c-section; I felt qthing" (02/12/2013)  . Dysrhythmia    palpitations during a neck infection-dx'd with Atrial fib- no problems snice then.  Marland Kitchen GERD (gastroesophageal reflux disease) 2000-2001   "hospitalized for a week"  . Gestational diabetes 2009  . Hypertension    "only during pregnancy" (02/12/2013)  . New onset atrial fibrillation (HCC) 02/11/2013  . PONV (postoperative nausea and vomiting)   . Retropharyngeal abscess 02/11/2013   "being tx'd w/ATB & steroids" (02/12/2013)   Past Surgical History:  Procedure Laterality Date  . CESAREAN SECTION  2006; 2009  . CESAREAN SECTION Left 03/08/2014   Procedure: REPEAT CESAREAN SECTION with left tubal ligation; LOW TRANSVERSE ABDOMINAL INCISION,  T UTERINE INCISION;  Surgeon: Bing Plume, MD;  Location: [facility];  Service: Obstetrics;  Laterality: Left;  . KELOID EXCISION Left 1990's   "back of my earlobe" (02/12/2013)   Family History  Problem Relation Age of Onset  . Diabetes Mellitus II Other   . Hypertension Other   . CAD Father 50   Social History   Substance Use Topics  . Smoking status: Former Smoker    Packs/day: 1.50    Years: 6.00  . Smokeless tobacco: Never Used     Comment: 02/12/2013 "quit smoking in ~ 2006"  . Alcohol use Yes     Comment: 02/12/2013 "drink occasionally; couple times/yr   OB History    Gravida Para Term Preterm AB Living   3 3 3     1    SAB TAB Ectopic Multiple Live Births         0 1     Review of Systems  Constitutional: Positive for fever. Negative for chills and fatigue.  HENT: Positive for sore throat. Negative for congestion, ear pain, sneezing and trouble swallowing.   Respiratory: Positive for cough. Negative for shortness of breath.        Cough with green mucous  Cardiovascular: Negative for chest pain and palpitations.  Gastrointestinal: Negative for abdominal pain.  Neurological: Negative for headaches.    Allergies  Review of patient's allergies indicates no known allergies.  Home Medications   Prior to Admission medications   Medication Sig Start Date End Date Taking? Authorizing Provider  IRON PO Take 1 tablet by mouth daily.    Historical Provider, MD  labetalol (NORMODYNE) 100 MG tablet Take 1 tablet (100 mg total) by mouth 2 (two) times daily. 03/18/14   Elmore Guise Clemmons, CNM  Prenatal Vit-Fe Fumarate-FA (PRENATAL MULTIVITAMIN) TABS tablet Take 1 tablet by mouth daily at 12 noon.    Historical Provider, MD   Meds Ordered and Administered this Visit  Medications -  No data to display  BP (!) 143/103 (BP Location: Right Arm)   Pulse 70   Temp 98.2 F (36.8 C) (Oral)   Resp 16   LMP 07/27/2015   SpO2 98%  No data found.   Physical Exam  Constitutional: She is oriented to person, place, and time. She appears well-developed and well-nourished.  HENT:  Head: Normocephalic.  Ear canals clear with pearly gray TM without erythema. Tonsils swollen and red bilaterally without exudate.   Eyes: Pupils are equal, round, and reactive to light.  Neck: Normal range of motion. Neck  supple.  Cardiovascular: Normal rate, regular rhythm and normal heart sounds.   Pulmonary/Chest: Effort normal and breath sounds normal.  Neurological: She is alert and oriented to person, place, and time.    Urgent Care Course   Clinical Course    Procedures (including critical care time)  Labs Review Labs Reviewed  CULTURE, GROUP A STREP [facility])  POCT RAPID STREP A    Imaging Review No results found.   Visual Acuity Review  Right Eye Distance:   Left Eye Distance:   Bilateral Distance:    Right Eye Near:   Left Eye Near:    Bilateral Near:         MDM   1. Viral pharyngitis    Rapid strep was negative. Will treat as a viral pharyngitis. Patient educated to use salt water gargle, honey, throat lozenges, or butterscotch candy to help with sore throat. Instructed to follow up if she does not improve. Specimen send off for culture. Discharge BP was 144/98.    Lucia Estelle, NP 08/24/15 1404

## 2015-08-24 NOTE — ED Triage Notes (Signed)
PT reports that 1.5 weeks ago she began to have a sore throat with tonsil swelling and fever. PT also reports a productive cough with green phlegm. PT reports throat pain has continued. PT had repeat cases of strep as a child. PT has been breastfeeding and has only tried natural treatments.

## 2015-08-26 LAB — CULTURE, GROUP A STREP (THRC)

## 2015-09-05 ENCOUNTER — Emergency Department (HOSPITAL_COMMUNITY): Payer: Self-pay

## 2015-09-05 ENCOUNTER — Emergency Department (HOSPITAL_COMMUNITY)
Admission: EM | Admit: 2015-09-05 | Discharge: 2015-09-05 | Disposition: A | Discharge status: Home / Self Care | Payer: Self-pay | Attending: Emergency Medicine | Admitting: Emergency Medicine

## 2015-09-05 ENCOUNTER — Encounter (HOSPITAL_COMMUNITY): Payer: Self-pay | Admitting: Emergency Medicine

## 2015-09-05 DIAGNOSIS — J36 Peritonsillar abscess: Secondary | ICD-10-CM | POA: Insufficient documentation

## 2015-09-05 DIAGNOSIS — I10 Essential (primary) hypertension: Secondary | ICD-10-CM | POA: Insufficient documentation

## 2015-09-05 DIAGNOSIS — Z87891 Personal history of nicotine dependence: Secondary | ICD-10-CM | POA: Insufficient documentation

## 2015-09-05 LAB — CBC WITH DIFFERENTIAL/PLATELET
Basophils Absolute: 0 10*3/uL (ref 0.0–0.1)
Basophils Relative: 0 %
EOS PCT: 1 %
Eosinophils Absolute: 0.2 10*3/uL (ref 0.0–0.7)
HEMATOCRIT: 41 % (ref 36.0–46.0)
Hemoglobin: 13.4 g/dL (ref 12.0–15.0)
LYMPHS PCT: 17 %
Lymphs Abs: 3.4 10*3/uL (ref 0.7–4.0)
MCH: 30 pg (ref 26.0–34.0)
MCHC: 32.7 g/dL (ref 30.0–36.0)
MCV: 91.7 fL (ref 78.0–100.0)
MONOS PCT: 8 %
Monocytes Absolute: 1.6 10*3/uL — ABNORMAL HIGH (ref 0.1–1.0)
NEUTROS PCT: 74 %
Neutro Abs: 14.8 10*3/uL — ABNORMAL HIGH (ref 1.7–7.7)
PLATELETS: 318 10*3/uL (ref 150–400)
RBC: 4.47 MIL/uL (ref 3.87–5.11)
RDW: 12.8 % (ref 11.5–15.5)
WBC: 20 10*3/uL — ABNORMAL HIGH (ref 4.0–10.5)

## 2015-09-05 LAB — COMPREHENSIVE METABOLIC PANEL
ALT: 11 U/L — AB (ref 14–54)
ANION GAP: 10 (ref 5–15)
AST: 14 U/L — ABNORMAL LOW (ref 15–41)
Albumin: 4.2 g/dL (ref 3.5–5.0)
Alkaline Phosphatase: 73 U/L (ref 38–126)
BUN: 6 mg/dL (ref 6–20)
CHLORIDE: 103 mmol/L (ref 101–111)
CO2: 25 mmol/L (ref 22–32)
Calcium: 9.7 mg/dL (ref 8.9–10.3)
Creatinine, Ser: 0.65 mg/dL (ref 0.44–1.00)
Glucose, Bld: 102 mg/dL — ABNORMAL HIGH (ref 65–99)
Potassium: 3.6 mmol/L (ref 3.5–5.1)
Sodium: 138 mmol/L (ref 135–145)
Total Bilirubin: 0.9 mg/dL (ref 0.3–1.2)
Total Protein: 9 g/dL — ABNORMAL HIGH (ref 6.5–8.1)

## 2015-09-05 LAB — I-STAT CG4 LACTIC ACID, ED
Lactic Acid, Venous: 1.09 mmol/L (ref 0.5–1.9)
Lactic Acid, Venous: 0.86 mmol/L (ref 0.5–1.9)

## 2015-09-05 MED ORDER — SODIUM CHLORIDE 0.9 % IV SOLN
3.0000 g | Freq: Once | INTRAVENOUS | Status: AC
  Administered 2015-09-05: 3 g via INTRAVENOUS
  Filled 2015-09-05: qty 3

## 2015-09-05 MED ORDER — IOPAMIDOL (ISOVUE-300) INJECTION 61%
INTRAVENOUS | Status: AC
  Administered 2015-09-05: 75 mL
  Filled 2015-09-05: qty 75

## 2015-09-05 MED ORDER — AMOXICILLIN-POT CLAVULANATE 875-125 MG PO TABS: 1.0000 | ORAL_TABLET | Freq: Two times a day (BID) | ORAL | 0 refills | Status: AC

## 2015-09-05 MED ORDER — SODIUM CHLORIDE 0.9 % IV BOLUS (SEPSIS)
1000.0000 mL | Freq: Once | INTRAVENOUS | Status: AC
  Administered 2015-09-05: 1000 mL via INTRAVENOUS

## 2015-09-05 MED ORDER — DEXAMETHASONE SODIUM PHOSPHATE 10 MG/ML IJ SOLN
10.0000 mg | Freq: Once | INTRAMUSCULAR | Status: AC
  Administered 2015-09-05: 10 mg via INTRAVENOUS
  Filled 2015-09-05: qty 1

## 2015-09-05 NOTE — ED Provider Notes (Signed)
[facility]-EMER[facility]ENCY DEPT Provider Note   CSN: [id number] Arrival date & time: 09/05/15  1309     History   Chief Complaint Chief Complaint  Patient presents with  . Sore Throat    HPI [name] is a 35 y.o. female.  HPI  Patient presents by recommendation of her PCP for evaluation of sore throat.  It has been going on one month. She reports being evaluated 2 weeks ago and was diagnosed with viral URI.  The pain has gotten worse, it is achy and sharp, worse on the left side. She has pain with swallowing.  Had fevers as recent as 2 days ago.  Is spitting up green mucus with blood.   Past Medical History:  Diagnosis Date  . Complication of anesthesia    "not strong enough during last c-section; I felt qthing" (02/12/2013)  . Dysrhythmia    palpitations during a neck infection-dx'd with Atrial fib- no problems snice then.  Marland Kitchen. GERD (gastroesophageal reflux disease) 2000-2001   "hospitalized for a week"  . Gestational diabetes 2009  . Hypertension    "only during pregnancy" (02/12/2013)  . New onset atrial fibrillation (HCC) 02/11/2013  . PONV (postoperative nausea and vomiting)   . Retropharyngeal abscess 02/11/2013   "being tx'd w/ATB & steroids" (02/12/2013)    Patient Active Problem List   Diagnosis Date Noted  . S/P cesarean section 03/08/2014  . Retropharyngeal abscess 02/12/2013  . New onset atrial fibrillation (HCC) 02/12/2013    Past Surgical History:  Procedure Laterality Date  . CESAREAN SECTION  2006; 2009  . CESAREAN SECTION Left 03/08/2014   Procedure: REPEAT CESAREAN SECTION with left tubal ligation; LOW TRANSVERSE ABDOMINAL INCISION,  T UTERINE INCISION;  Surgeon: Bing Plumehomas F Henley, MD;  Location: [facility];  Service: Obstetrics;  Laterality: Left;  . KELOID EXCISION Left 1990's   "back of my earlobe" (02/12/2013)    OB History    Gravida Para Term Preterm AB Living   3 3 3     1    SAB TAB Ectopic Multiple Live Births         0 1       Home Medications      Prior to Admission medications   Medication Sig Start Date End Date Taking? Authorizing Provider  IRON PO Take 1 tablet by mouth daily.    Historical Provider, MD  labetalol (NORMODYNE) 100 MG tablet Take 1 tablet (100 mg total) by mouth 2 (two) times daily. 03/18/14   Elmore GuiseLori A Clemmons, CNM  Prenatal Vit-Fe Fumarate-FA (PRENATAL MULTIVITAMIN) TABS tablet Take 1 tablet by mouth daily at 12 noon.    Historical Provider, MD    Family History Family History  Problem Relation Age of Onset  . Diabetes Mellitus II Other   . Hypertension Other   . CAD Father 8654    Social History Social History  Substance Use Topics  . Smoking status: Former Smoker    Packs/day: 1.50    Years: 6.00  . Smokeless tobacco: Never Used     Comment: 02/12/2013 "quit smoking in ~ 2006"  . Alcohol use Yes     Comment: 02/12/2013 "drink occasionally; couple times/yr     Allergies   Review of patient's allergies indicates no known allergies.   Review of Systems Review of Systems  Constitutional: Negative for chills and fever.  HENT: Negative for ear pain and sore throat.        Odynophagia, dysphagia, halitosis    Eyes: Negative for pain  and visual disturbance.  Respiratory: Negative for cough and shortness of breath.   Cardiovascular: Negative for chest pain and palpitations.  Gastrointestinal: Negative for abdominal pain and vomiting.  Genitourinary: Negative for dysuria and hematuria.  Musculoskeletal: Negative for arthralgias and back pain.  Skin: Negative for color change and rash.  Neurological: Negative for seizures and syncope.  All other systems reviewed and are negative.    Physical Exam Updated Vital Signs BP (!) 165/120 (BP Location: Right Arm)   Pulse 102   Temp 98.9 F (37.2 C) (Oral)   Resp 16   Ht 5\' 10"  (1.778 m)   Wt 95.7 kg   LMP 07/27/2015   SpO2 99%   BMI 30.28 kg/m   Physical Exam  Constitutional: She appears well-developed and well-nourished. No distress.  HENT:   Head: Normocephalic and atraumatic.  Halitosis, enlarged tonsils bilaterally, mild rightward uvula deviation  Eyes: Conjunctivae are normal.  Neck: Neck supple.  Cardiovascular: Normal rate and regular rhythm.   No murmur heard. Pulmonary/Chest: Effort normal and breath sounds normal. No respiratory distress.  Abdominal: Soft. There is no tenderness.  Musculoskeletal: She exhibits no edema.  Neurological: She is alert.  Skin: Skin is warm and dry.  Psychiatric: She has a normal mood and affect.  Nursing note and vitals reviewed.    ED Treatments / Results  Labs (all labs ordered are listed, but only abnormal results are displayed) Labs Reviewed  COMPREHENSIVE METABOLIC PANEL - Abnormal; Notable for the following:       Result Value   Glucose, Bld 102 (*)    Total Protein 9.0 (*)    AST 14 (*)    ALT 11 (*)    All other components within normal limits  CBC WITH DIFFERENTIAL/PLATELET - Abnormal; Notable for the following:    WBC 20.0 (*)    Neutro Abs 14.8 (*)    Monocytes Absolute 1.6 (*)    All other components within normal limits  I-STAT CG4 LACTIC ACID, ED  I-STAT CG4 LACTIC ACID, ED    EKG  EKG Interpretation None       Radiology No results found.  Procedures Procedures (including critical care time)  Medications Ordered in ED Medications - No data to display   Initial Impression / Assessment and Plan / ED Course  I have reviewed the triage vital signs and the nursing notes.  Pertinent labs & imaging results that were available during my care of the patient were reviewed by me and considered in my medical decision making (see chart for details).  Clinical Course    Patient presents for sore throat.  Concern for PTA, so CT performed, and showed PTA.  No airway compromise on exam.  ENT called, and steroids + fluids + abx given.  No signs of systemic illness.  Discussed abx choice with pharmacy in regards to patient's breastfeeding.  Patient tolerating  PO on reassessment.  Will f/u with ENT.  We have discussed the discharge plan, including the plan for outpatient followup, and strict return precautions, including those that would require calling 911.     Final Clinical Impressions(s) / ED Diagnoses   Final diagnoses:  None    New Prescriptions New Prescriptions   No medications on file     Marcelina MorelMichael Cai Flott, MD 09/05/15 2236    Doug SouSam Jacubowitz, MD 09/06/15 0045

## 2015-09-05 NOTE — ED Provider Notes (Signed)
Complains of swallowing progressively worsening for one month. Pain is at left side of throat.. Associated symptoms include temperature 101. On exam patient is alert and nontoxic. Handling secretions well. Oropharynx tonsils large bilaterally uvula deviated to right. Tender cervical adenopathy. Handling secretions well.   Doug SouSam Demontae Antunes, MD 09/05/15 [phone]

## 2015-09-05 NOTE — ED Triage Notes (Signed)
To ED sent from urgent care, with possible peritonsillar abscess, throat swollen, difficulty swallowing started coughing up blood on Saturday

## 2015-09-05 NOTE — ED Notes (Signed)
Patient transported to CT
